# Patient Record
Sex: Female | Born: 1995 | Race: Black or African American | Hispanic: No | Marital: Single | Smoking: Never smoker
Health system: Southern US, Community
[De-identification: ages and names within clinical notes are randomized; demographics above are authoritative.]

## PROBLEM LIST (undated history)

## (undated) DIAGNOSIS — E061 Subacute thyroiditis: Secondary | ICD-10-CM

## (undated) DIAGNOSIS — F419 Anxiety disorder, unspecified: Secondary | ICD-10-CM

## (undated) HISTORY — DX: Subacute thyroiditis: E06.1

---
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
## 2017-02-05 ENCOUNTER — Telehealth: Payer: Self-pay | Admitting: Internal Medicine

## 2017-02-05 NOTE — Telephone Encounter (Signed)
Appt. has been changed to 30 mins.

## 2017-02-05 NOTE — Telephone Encounter (Unsigned)
Copied from CRM [id number]#[id number]. Topic: Appointment Scheduling - Scheduling Inquiry for Clinic >> Feb 05, 2017 11:13 AM Raquel SarnaHayes, Teresa G wrote: I made an appt for pt on Wed. @ 9:45.  However, it will not allow me to use 30 mins.  There was a available time after this time to make a 30 min appt. Can this be fixed?   Pt needs to get in to see Dr. Fabian SharpPanosh asap.

## 2017-02-07 ENCOUNTER — Ambulatory Visit (INDEPENDENT_AMBULATORY_CARE_PROVIDER_SITE_OTHER): Payer: BLUE CROSS/BLUE SHIELD | Admitting: Internal Medicine

## 2017-02-07 ENCOUNTER — Encounter: Payer: Self-pay | Admitting: Internal Medicine

## 2017-02-07 VITALS — BP 124/82 | HR 71 | Temp 98.8°F | Wt 164.4 lb

## 2017-02-07 DIAGNOSIS — Z79899 Other long term (current) drug therapy: Secondary | ICD-10-CM

## 2017-02-07 DIAGNOSIS — F418 Other specified anxiety disorders: Secondary | ICD-10-CM

## 2017-02-07 MED ORDER — CITALOPRAM HYDROBROMIDE 20 MG PO TABS: ORAL_TABLET | ORAL | 1 refills | Status: DC

## 2017-02-07 NOTE — Patient Instructions (Addendum)
Continue counseling   Plan add medication and increase dose as helpful .  nuisance side effects usually gone in a week if happen at all.   May take 2-5266m weeks to see  Good response . Send a  message in my chart if having ?s in interim .   Optimize sleep patterns . exercise as possible  To help mental clarity .   Plan ROV  During break  End of December early  January.

## 2017-02-07 NOTE — Progress Notes (Signed)
Chief Complaint  Patient presents with  . Medication Management    Discuss medications for anxiety and depression. [facility]     HPI: Brandy Sanchez 21 y.o. come in for  Medication help for depressive anxiety sx     Seeing counselor at school [employer] but  Consider   Thinking  Of  Changing school.   Dec motivation .    Some anxiety and hyperventilation .   No obv triggers .  Not doing as well. And wants   Was nursing and   Public health  And hispanic      ? Buspar. Mentioned   Night hard.  Neg tad  Feels safe but is starting to isolate herself and dec motivation ROS: See pertinent positives and negatives per HPI. No cv pulm sx   Sophomore at [employer]    Past Medical History:  Diagnosis Date  . Allergic rhinitis   . Macrocephaly    as infant  . Viral thyroiditis 09/09/2013    Family History  Problem Relation Age of Onset  . Thyroid disease Mother        removed due to goiter and trouble swallowing  . Hypertension Mother   . Thyroid disease Maternal Grandmother   . Diabetes Paternal Grandmother   . Thyroid disease Maternal Aunt        removed- enlarged    Social History   Socioeconomic History  . Marital status: Single    Spouse name: None  . Number of children: None  . Years of education: None  . Highest education level: None  Social Needs  . Financial resource strain: None  . Food insecurity - worry: None  . Food insecurity - inability: None  . Transportation needs - medical: None  . Transportation needs - non-medical: None  Occupational History  . None  Tobacco Use  . Smoking status: Never Smoker  . Smokeless tobacco: Never Used  Substance and Sexual Activity  . Alcohol use: No  . Drug use: No  . Sexual activity: No  Other Topics Concern  . 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 ibuprofen (ADVIL,MOTRIN) 600 MG tablet Take 1 tablet (600 mg total) by mouth every 6 (six) hours as needed. 30 tablet 0  . levonorgestrel-ethinyl estradiol (AVIANE,ALESSE,LESSINA) 0.1-20 MG-MCG tablet Take 1 tablet by mouth daily. 1 Package 11  . lidocaine (XYLOCAINE) 2 % jelly Apply 1 application topically as needed. 30 mL 0  . metroNIDAZOLE (FLAGYL) 500 MG tablet Take 1 tablet (500 mg total) by mouth 2 (two) times daily. (Patient not taking: Reported on 02/07/2017) 14 tablet 0   No facility-administered medications prior to visit.      EXAM:  BP 124/82 (BP Location: Right Arm, Patient Position: Sitting, Cuff Size: Normal)   Pulse 71   Temp 98.8 F (37.1 C) (Oral)   Wt 164 lb 6.4 oz (74.6 kg)   BMI 27.39 kg/m   Body mass index is 27.39 kg/m.  GENERAL: vitals reviewed and listed above, alert, oriented, appears well hydrated and in no acute distress HEENT: atraumatic,  conjunctiva  clear, no obvious abnormalities on inspection of external nose and ears PSYCH: pleasant and cooperative, no obvious depression or anxiety Lab Results  Component Value Date   WBC 5.1 01/21/2013   HGB 12.7 07/23/2015   HCT 39.0 01/21/2013   PLT 370.0 01/21/2013   GLUCOSE 62 (L) 01/21/2013   CHOL 139 01/21/2013   TRIG 85.0 01/21/2013   HDL 58.30 01/21/2013   LDLCALC 64 01/21/2013   ALT 21 01/21/2013   AST 19 01/21/2013   NA 138 01/21/2013   K 3.9 01/21/2013   CL 104 01/21/2013   CREATININE 0.9 01/21/2013   BUN 11 01/21/2013   CO2 28 01/21/2013   TSH 0.731 05/22/2013   BP Readings from Last 3 Encounters:  02/07/17 124/82  12/04/16 (!) 100/58  10/20/16 118/72 (72 %, Z = 0.58 /  76 %, Z = 0.71)*   *BP percentiles are based on the August 2017 AAP Clinical Practice Guideline for girls   PHQ-SADS Somatic: 18 tired sleep GI  GAD7:9  Pos panic  PHQ9:23 not suicidal Difficulty  :extreme  .ASSESSMENT AND PLAN:  Discussed the following assessment and plan:  Depression with anxiety  Medication management Some developmental  Counseling  Appropriate    Add controller med feel ssri m,ay be better than buspar alone     Expectant management. Benefit more than risk of med .  Plan rov end dec  Contact us in  3 weeks if needed about  Med or any concerns  To continue counseling  -Patient advised to return or notify health care team  if  new concerns arise.  Patient Instructions  Continue counseling   Plan add medication and increase dose as helpful .  nuisance side effects usually gone in a week if happen at all.   May take 2-6060m weeks to see  Good response . Send a  message in my chart if having ?s in interim .   Optimize sleep patterns . exercise as possible  To help mental clarity .   Plan ROV  During break  End of December early  January.       Neta MendsWanda K. Panosh M.D.

## 2017-03-07 ENCOUNTER — Encounter: Payer: Self-pay | Admitting: Internal Medicine

## 2017-03-08 NOTE — Telephone Encounter (Signed)
Please call in #30 of these

## 2017-03-08 NOTE — Telephone Encounter (Signed)
Dr Clent RidgesFry please advise if you are okay with sending a temporary Rx for the Citalopram.  [place] in [place], she is home for Winter Break x 2 weeks.  Pt aware that Dr Fabian SharpPanosh is out until 05/13/16  Last filled 02/07/17 Pt is currently taking 20mg  tabs daily.   citalopram (CELEXA) 20 MG tablet [[id number]][[id number]]  Dose, Route, Frequency: As Directed   Dispense Quantity: 30 tablet Refills: 1 Fills remaining: --        Sig: Take 10 mg Po qd x 1 week ,then 20 mg Po  Qd Or as directed

## 2017-03-09 MED ORDER — CITALOPRAM HYDROBROMIDE 20 MG PO TABS: ORAL_TABLET | ORAL | 0 refills | Status: DC

## 2017-03-09 NOTE — Telephone Encounter (Signed)
Citalopram sent # 30 to [facility], [address].

## 2017-03-29 ENCOUNTER — Encounter: Payer: Self-pay | Admitting: Internal Medicine

## 2017-03-29 NOTE — Telephone Encounter (Signed)
If she is taking 20 gm  Have her decrease to 10 mg per day for a week and then decide if we need to wean or change medication   E mail in  Or rov   I doubt  If the the jaw problem is from the medicaiton  I would have her see someone at school or  [employer] about the jaw issue

## 2017-03-29 NOTE — Telephone Encounter (Signed)
Please advise Dr Panosh, thanks.   

## 2017-05-04 ENCOUNTER — Other Ambulatory Visit: Payer: Self-pay | Admitting: Internal Medicine

## 2017-06-03 ENCOUNTER — Other Ambulatory Visit: Payer: Self-pay | Admitting: Internal Medicine

## 2017-06-07 NOTE — Telephone Encounter (Signed)
Celexa refilled one time, needs OV for further refills for med check. Pt aware via voicemail - detailed message left.  Nothing further needed.

## 2017-06-27 ENCOUNTER — Encounter: Payer: Self-pay | Admitting: Physician Assistant

## 2017-07-12 ENCOUNTER — Other Ambulatory Visit: Payer: Self-pay | Admitting: Internal Medicine
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
## 2017-07-27 NOTE — Progress Notes (Signed)
Chief Complaint  Patient presents with  . Annual Exam    Discuss Birth Control / Discuss changing Citalopram / Discuss getting pap and mammo    HPI: Patient  Brandy Sanchez  22 y.o. comes in today for Preventive Health Care visit   And med  ocps    Stopped celexa about  march  Felt it helped  Her anxiety but  Felt   Physically stiff   Stopped  And got  Better anxiety not as bad but coming back   Counseling had helped  Only took 10 mg    citalopram  Would like to try something else  Not as bad as before Periods : lmp April 18   None feb march   On ocps    ocass missed pill skipped 2 mos and then last  Was ok April  Some weight gain .   Health Maintenance  Topic Date Due  . INFLUENZA VACCINE  10/18/2017  . TETANUS/TDAP  08/27/2018  . HIV Screening  Completed   Health Maintenance Review LIFESTYLE:  Exercise:   Just started  back exercise    Tobacco/ETS:  no Alcohol:   no Sugar beverages: every day  Sleep: 6   -7  Drug use: no HH of off campus  1  Fish  [employer]  public health   ROS: see above  GEN/ HEENT: No fever, significant weight changes sweats headaches vision problems hearing changes, CV/ PULM; No chest pain shortness of breath cough, syncope,edema  change in exercise tolerance. GI /GU: No adominal pain, vomiting, change in bowel habits. No blood in the stool. No significant GU symptoms. SKIN/HEME: ,no acute skin rashes suspicious lesions or bleeding. No lymphadenopathy, nodules, masses.  NEURO/ PSYCH:  No neurologic signs such as weakness numbness. . IMM/ Allergy: No unusual infections.  Allergy .   REST of 12 system review negative except as per HPI   Past Medical History:  Diagnosis Date  . Allergic rhinitis   . Macrocephaly    as infant  . Viral thyroiditis 09/09/2013    No past surgical history on file.  Family History  Problem Relation Age of Onset  . Thyroid disease Mother        removed due to goiter and trouble swallowing  . Hypertension  Mother   . Thyroid disease Maternal Grandmother   . Diabetes Paternal Grandmother   . Thyroid disease Maternal Aunt        removed- enlarged    Social History   Socioeconomic History  . Marital status: Single    Spouse name: Not on file  . Number of children: Not on file  . Years of education: Not on file  . Highest education level: Not on file  Occupational History  . Not on file  Social Needs  . Financial resource strain: Not on file  . Food insecurity:    Worry: Not on file    Inability: Not on file  . Transportation needs:    Medical: Not on file    Non-medical: Not on file  Tobacco Use  . Smoking status: Never Smoker  . Smokeless tobacco: Never Used  Substance and Sexual Activity  . Alcohol use: No  . Drug use: No  . Sexual activity: Never  Lifestyle  . Physical activity:    Days per week: Not on file    Minutes per session: Not on file  . Stress: Not on file  Relationships  . Social connections:  Talks on phone: Not on file    Gets together: Not on file    Attends religious service: Not on file    Active member of club or organization: Not on file    Attends meetings of clubs or organizations: Not on file    Relationship status: Not on file  Other Topics Concern  . 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 citalopram (CELEXA) 20 MG tablet TAKE 1/2 TAB DAILY X 1 WEEK, THEN 1 TAB DAILY OR AS DIRECTED. **DUE FOR MED CHECK APPT 30 tablet 0  . ibuprofen (ADVIL,MOTRIN) 600 MG tablet Take 1 tablet (600 mg total) by mouth every 6 (six) hours as needed. 30 tablet 0  . levonorgestrel-ethinyl estradiol (AVIANE,ALESSE,LESSINA) 0.1-20 MG-MCG tablet Take 1 tablet by mouth daily. 1 Package 11  .  lidocaine (XYLOCAINE) 2 % jelly Apply 1 application topically as needed. 30 mL 0   No facility-administered medications prior to visit.      EXAM:  BP 124/82 (BP Location: Right Arm, Patient Position: Sitting, Cuff Size: Normal)   Pulse 74   Temp 98.1 F (36.7 C) (Oral)   Ht 5' 3.75" (1.619 m)   Wt 172 lb 12.8 oz (78.4 kg)   BMI 29.89 kg/m   Body mass index is 29.89 kg/m. Wt Readings from Last 3 Encounters:  07/30/17 172 lb 12.8 oz (78.4 kg)  02/07/17 164 lb 6.4 oz (74.6 kg) (89 %, Z= 1.25)*  12/04/16 167 lb 3.2 oz (75.8 kg) (91 %, Z= 1.33)*   * Growth percentiles are based on CDC (Girls, 2-20 Years) data.    Physical Exam: Vital signs reviewed FXT:KWIO is a well-developed well-nourished alert cooperative    who appearsr stated age in no acute distress.  HEENT: normocephalic atraumatic , Eyes: PERRL EOM's full, conjunctiva clear, Nares: paten,t no deformity discharge or tenderness., Ears: no deformity EAC's clear TMs with normal landmarks. Mouth: clear OP, no lesions, edema.  Moist mucous membranes. Dentition in adequate repair. NECK: supple without masses palpable thyroid  or bruits. CHEST/PULM:  Clear to auscultation and percussion breath sounds equal no wheeze , rales or rhonchi. No chest wall deformities or tenderness. Breast: normal by inspection . No dimpling, discharge, masses, tenderness or discharge . CV: PMI is nondisplaced, S1 S2 no gallops, murmurs, rubs. Peripheral pulses are full without delay.No JVD .  ABDOMEN: Bowel sounds normal nontender  No guard or rebound, no hepato splenomegal no CVA tenderness.  No hernia. Extremtities:  No clubbing cyanosis or edema, no acute joint swelling or redness no focal atrophy NEURO:  Oriented x3, cranial nerves 3-12 appear to be intact, no obvious focal weakness,gait within normal limits no abnormal reflexes or asymmetrical SKIN: No acute rashes normal turgor, color, no bruising or petechiae. PSYCH: Oriented, good eye contact,  no obvious depression anxiety, cognition and judgment appear normal. LN: no cervical axillary inguinal adenopathy  Lab Results  Component Value Date   WBC 4.5 07/30/2017   HGB 13.0 07/30/2017   HCT 38.6 07/30/2017   PLT 417.0 (H) 07/30/2017   GLUCOSE 78 07/30/2017   CHOL 123  07/30/2017   TRIG 44.0 07/30/2017   HDL 48.10 07/30/2017   LDLCALC 66 07/30/2017   ALT 25 07/30/2017   AST 35 07/30/2017   NA 139 07/30/2017   K 4.2 07/30/2017   CL 107 07/30/2017   CREATININE 0.87 07/30/2017   BUN 8 07/30/2017   CO2 26 07/30/2017   TSH 0.78 07/30/2017    BP Readings from Last 3 Encounters:  07/30/17 124/82  02/07/17 124/82  12/04/16 (!) 100/58    Lab results reviewed with patient   ASSESSMENT AND PLAN:  Discussed the following assessment and plan:  Visit for preventive health examination - Plan: Urine cytology ancillary only, CBC with Differential/Platelet, Lipid panel, TSH, CMP, HIV antibody, RPR, T4, free  Medication management - Plan: Urine cytology ancillary only, CBC with Differential/Platelet, Lipid panel, TSH, CMP, HIV antibody, RPR, T4, free  Oral contraceptive use - skipped menses  until april and nl  follow and  foucs on avoiding missed doses - Plan: Urine cytology ancillary only, CBC with Differential/Platelet, Lipid panel, TSH, CMP, HIV antibody, RPR, T4, free  Medication side effect - Plan: Urine cytology ancillary only, CBC with Differential/Platelet, Lipid panel, TSH, CMP, HIV antibody, RPR, T4, free  Depression with anxiety - Plan: Urine cytology ancillary only, CBC with Differential/Platelet, Lipid panel, TSH, CMP, HIV antibody, RPR, T4, free  Routine screening for STI (sexually transmitted infection) - Plan: Urine cytology ancillary only  Patient Care Team: Burnis Medin, MD as PCP - General Patient Instructions  Get back with counseling   Trying different med and fu   In 1 montth or thereabouts  For med check   Screening labs and  Urine today.   Will  notify you  of labs when available. Plan low dose fluoxetine  10 mg and can increase to 20 mg  After 2-3 weeks   ROV in 1 months or as needed   PAP smear when  23  And mammogram when about 48 - 50 years of age .     Preventive Care for [place], Female The transition to life after high school as a young adult can be a stressful time with many changes. You may start seeing a primary care physician instead of a pediatrician. This is the time when your health care becomes your responsibility. Preventive care refers to lifestyle choices and visits with your health care provider that can promote health and wellness. What does preventive care include?  A yearly physical exam. This is also called an annual wellness visit.  Dental exams once or twice a year.  Routine eye exams. Ask your health care provider how often you should have your eyes checked.  Personal lifestyle choices, including: ? Daily care of your teeth and gums. ? Regular physical activity. ? Eating a healthy diet. ? Avoiding tobacco and drug use. ? Avoiding or limiting alcohol use. ? Practicing safe sex. ? Taking vitamin and mineral supplements as recommended by your health care provider. What happens during an annual wellness visit? Preventive care starts with a yearly visit to your primary care physician. The services and screenings done by your health care provider during your annual wellness visit will depend on your overall health, lifestyle risk factors, and family history of disease. Counseling Your health care provider may ask you questions about:  Past medical problems and your family's medical history.  Medicines or supplements you take.  Health insurance and access to health care.  Alcohol, tobacco, and drug use.  Your safety at home,  work, or school.  Access to firearms.  Emotional well-being and how you cope with stress.  Relationship well-being.  Diet, exercise, and sleep habits.  Your  sexual health and activity.  Your methods of birth control.  Your menstrual cycle.  Your pregnancy history.  Screening You may have the following tests or measurements:  Height, weight, and BMI.  Blood pressure.  Lipid and cholesterol levels.  Tuberculosis skin test.  Skin exam.  Vision and hearing tests.  Screening test for hepatitis.  Screening tests for sexually transmitted diseases (STDs), if you are at risk.  BRCA-related cancer screening. This may be done if you have a family history of breast, ovarian, tubal, or peritoneal cancers.  Pelvic exam and Pap test. This may be done every 3 years starting at age 31.  Vaccines Your health care provider may recommend certain vaccines, such as:  Influenza vaccine. This is recommended every year.  Tetanus, diphtheria, and acellular pertussis (Tdap, Td) vaccine. You may need a Td booster every 10 years.  Varicella vaccine. You may need this if you have not been vaccinated.  HPV vaccine. If you are 49 or younger, you may need three doses over 6 months.  Measles, mumps, and rubella (MMR) vaccine. You may need at least one dose of MMR. You may also need a second dose.  Pneumococcal 13-valent conjugate (PCV13) vaccine. You may need this if you have certain conditions and were not previously vaccinated.  Pneumococcal polysaccharide (PPSV23) vaccine. You may need one or two doses if you smoke cigarettes or if you have certain conditions.  Meningococcal vaccine. One dose is recommended if you are age 27-21 years and a first-year college student living in a residence hall, or if you have one of several medical conditions. You may also need additional booster doses.  Hepatitis A vaccine. You may need this if you have certain conditions or if you travel or work in places where you may be exposed to hepatitis A.  Hepatitis B vaccine. You may need this if you have certain conditions or if you travel or work in places where you may  be exposed to hepatitis B.  Haemophilus influenzae type b (Hib) vaccine. You may need this if you have certain risk factors.  Talk to your health care provider about which screenings and vaccines you need and how often you need them. What steps can I take to develop healthy behaviors?  Have regular preventive health care visits with your primary care physician and dentist.  Eat a healthy diet.  Drink enough fluid to keep your urine clear or pale yellow.  Stay active. Exercise at least 30 minutes 5 or more days of the week.  Use alcohol responsibly.  Maintain a healthy weight.  Do not use any products that contain nicotine, such as cigarettes, chewing tobacco, and e-cigarettes. If you need help quitting, ask your health care provider.  Do not use drugs.  Practice safe sex.  Use birth control (contraception) to prevent unwanted pregnancy. If you plan to become pregnant, see your health care provider for a pre-conception visit.  Find healthy ways to manage stress. How can I protect myself from injury? Injuries from violence or accidents are the leading cause of death among young adults and can often be prevented. Take these steps to help protect yourself:  Always wear your seat belt while driving or riding in a vehicle.  Do not drive if you have been drinking alcohol. Do not ride with someone who has been drinking.  Do not drive when you are tired or distracted. Do not text while driving.  Wear a helmet and other protective equipment during sports activities.  If you have firearms in your house, make sure you follow all gun safety procedures.  Seek help if you have been bullied, physically abused, or sexually abused.  Use the Internet responsibly to avoid dangers such as online bullying and online sexual predators.  What can I do to cope with stress? Young adults may face many new challenges that can be stressful, such as finding a job, going to college, moving away from  home, managing money, being in a relationship, getting married, and having children. To manage stress:  Avoid known stressful situations when you can.  Exercise regularly.  Find a stress-reducing activity that works best for you. Examples include meditation, yoga, listening to music, or reading.  Spend time in nature.  Keep a journal to write about your stress and how you respond.  Talk to your health care provider about stress. He or she may suggest counseling.  Spend time with supportive friends or family.  Do not cope with stress by: ? Drinking alcohol or using drugs. ? Smoking cigarettes. ? Eating.  Where can I get more information? Learn more about preventive care and healthy habits from:  [place] and Gynecologists: KaraokeExchange.nl  U.S. Probation officer Task Force: StageSync.si  National Adolescent and [place]: StrategicRoad.nl  American Academy of Pediatrics Bright Futures: https://brightfutures.MemberVerification.co.za  Society for Adolescent Health and Medicine: MoralBlog.co.za.aspx  PodExchange.nl: ToyLending.fr  This information is not intended to replace advice given to you by your health care provider. Make sure you discuss any questions you have with your health care provider. Document Released: 07/22/2015 Document Revised: 08/12/2015 Document Reviewed: 07/22/2015 Elsevier Interactive Patient Education  2018 [place]. Tracyann Duffell M.D.

## 2017-07-30 ENCOUNTER — Other Ambulatory Visit (HOSPITAL_COMMUNITY)
Admission: RE | Admit: 2017-07-30 | Discharge: 2017-07-30 | Disposition: A | Discharge status: Home / Self Care | Payer: BLUE CROSS/BLUE SHIELD | Source: Ambulatory Visit | Attending: Internal Medicine | Admitting: Internal Medicine

## 2017-07-30 ENCOUNTER — Encounter: Payer: Self-pay | Admitting: Internal Medicine

## 2017-07-30 ENCOUNTER — Ambulatory Visit (INDEPENDENT_AMBULATORY_CARE_PROVIDER_SITE_OTHER): Payer: BLUE CROSS/BLUE SHIELD | Admitting: Internal Medicine

## 2017-07-30 VITALS — BP 124/82 | HR 74 | Temp 98.1°F | Ht 63.75 in | Wt 172.8 lb

## 2017-07-30 DIAGNOSIS — Z3041 Encounter for surveillance of contraceptive pills: Secondary | ICD-10-CM | POA: Diagnosis not present

## 2017-07-30 DIAGNOSIS — Z79899 Other long term (current) drug therapy: Secondary | ICD-10-CM

## 2017-07-30 DIAGNOSIS — Z0000 Encounter for general adult medical examination without abnormal findings: Secondary | ICD-10-CM | POA: Insufficient documentation

## 2017-07-30 DIAGNOSIS — Z113 Encounter for screening for infections with a predominantly sexual mode of transmission: Secondary | ICD-10-CM

## 2017-07-30 DIAGNOSIS — T887XXA Unspecified adverse effect of drug or medicament, initial encounter: Secondary | ICD-10-CM

## 2017-07-30 DIAGNOSIS — F418 Other specified anxiety disorders: Secondary | ICD-10-CM

## 2017-07-30 LAB — CBC WITH DIFFERENTIAL/PLATELET
BASOS ABS: 0 10*3/uL (ref 0.0–0.1)
BASOS PCT: 0.6 % (ref 0.0–3.0)
EOS PCT: 2.7 % (ref 0.0–5.0)
Eosinophils Absolute: 0.1 10*3/uL (ref 0.0–0.7)
HEMATOCRIT: 38.6 % (ref 36.0–46.0)
Hemoglobin: 13 g/dL (ref 12.0–15.0)
LYMPHS ABS: 2.1 10*3/uL (ref 0.7–4.0)
LYMPHS PCT: 47.3 % — AB (ref 12.0–46.0)
MCHC: 33.7 g/dL (ref 30.0–36.0)
MCV: 88.6 fl (ref 78.0–100.0)
MONOS PCT: 9.4 % (ref 3.0–12.0)
Monocytes Absolute: 0.4 10*3/uL (ref 0.1–1.0)
NEUTROS ABS: 1.8 10*3/uL (ref 1.4–7.7)
Neutrophils Relative %: 40 % — ABNORMAL LOW (ref 43.0–77.0)
PLATELETS: 417 10*3/uL — AB (ref 150.0–400.0)
RBC: 4.36 Mil/uL (ref 3.87–5.11)
RDW: 12.2 % (ref 11.5–14.6)
WBC: 4.5 10*3/uL (ref 4.5–10.5)

## 2017-07-30 LAB — COMPREHENSIVE METABOLIC PANEL
ALT: 25 U/L (ref 0–35)
AST: 35 U/L (ref 0–37)
Albumin: 4.2 g/dL (ref 3.5–5.2)
Alkaline Phosphatase: 46 U/L (ref 39–117)
BUN: 8 mg/dL (ref 6–23)
CALCIUM: 9.4 mg/dL (ref 8.4–10.5)
CO2: 26 meq/L (ref 19–32)
CREATININE: 0.87 mg/dL (ref 0.40–1.20)
Chloride: 107 mEq/L (ref 96–112)
GFR: 106.5 mL/min (ref >60.00)
GLUCOSE: 78 mg/dL (ref 70–99)
Potassium: 4.2 mEq/L (ref 3.5–5.1)
Sodium: 139 mEq/L (ref 135–145)
Total Bilirubin: 0.4 mg/dL (ref 0.2–1.2)
Total Protein: 7.4 g/dL (ref 6.0–8.3)

## 2017-07-30 LAB — T4, FREE: Free T4: 0.88 ng/dL (ref 0.60–1.60)

## 2017-07-30 LAB — TSH: TSH: 0.78 u[IU]/mL (ref 0.35–5.50)

## 2017-07-30 LAB — LIPID PANEL
CHOL/HDL RATIO: 3
Cholesterol: 123 mg/dL (ref 0–200)
HDL: 48.1 mg/dL (ref >39.00)
LDL Cholesterol: 66 mg/dL (ref 0–99)
NONHDL: 74.45
Triglycerides: 44 mg/dL (ref 0.0–149.0)
VLDL: 8.8 mg/dL (ref 0.0–40.0)

## 2017-07-30 MED ORDER — FLUOXETINE HCL 10 MG PO CAPS: 10.0000 mg | ORAL_CAPSULE | Freq: Every day | ORAL | 0 refills | Status: DC

## 2017-07-30 NOTE — Patient Instructions (Addendum)
Get back with counseling   Trying different med and fu   In 1 montth or thereabouts  For med check   Screening labs and  Urine today.   Will notify you  of labs when available. Plan low dose fluoxetine  10 mg and can increase to 20 mg  After 2-3 weeks   ROV in 1 months or as needed   PAP smear when  23  And mammogram when about 39 - 20 years of age .     Preventive Care for [place], Female The transition to life after high school as a young adult can be a stressful time with many changes. You may start seeing a primary care physician instead of a pediatrician. This is the time when your health care becomes your responsibility. Preventive care refers to lifestyle choices and visits with your health care provider that can promote health and wellness. What does preventive care include?  A yearly physical exam. This is also called an annual wellness visit.  Dental exams once or twice a year.  Routine eye exams. Ask your health care provider how often you should have your eyes checked.  Personal lifestyle choices, including: ? Daily care of your teeth and gums. ? Regular physical activity. ? Eating a healthy diet. ? Avoiding tobacco and drug use. ? Avoiding or limiting alcohol use. ? Practicing safe sex. ? Taking vitamin and mineral supplements as recommended by your health care provider. What happens during an annual wellness visit? Preventive care starts with a yearly visit to your primary care physician. The services and screenings done by your health care provider during your annual wellness visit will depend on your overall health, lifestyle risk factors, and family history of disease. Counseling Your health care provider may ask you questions about:  Past medical problems and your family's medical history.  Medicines or supplements you take.  Health insurance and access to health care.  Alcohol, tobacco, and drug use.  Your safety at home, work, or school.  Access  to firearms.  Emotional well-being and how you cope with stress.  Relationship well-being.  Diet, exercise, and sleep habits.  Your sexual health and activity.  Your methods of birth control.  Your menstrual cycle.  Your pregnancy history.  Screening You may have the following tests or measurements:  Height, weight, and BMI.  Blood pressure.  Lipid and cholesterol levels.  Tuberculosis skin test.  Skin exam.  Vision and hearing tests.  Screening test for hepatitis.  Screening tests for sexually transmitted diseases (STDs), if you are at risk.  BRCA-related cancer screening. This may be done if you have a family history of breast, ovarian, tubal, or peritoneal cancers.  Pelvic exam and Pap test. This may be done every 3 years starting at age 84.  Vaccines Your health care provider may recommend certain vaccines, such as:  Influenza vaccine. This is recommended every year.  Tetanus, diphtheria, and acellular pertussis (Tdap, Td) vaccine. You may need a Td booster every 10 years.  Varicella vaccine. You may need this if you have not been vaccinated.  HPV vaccine. If you are 65 or younger, you may need three doses over 6 months.  Measles, mumps, and rubella (MMR) vaccine. You may need at least one dose of MMR. You may also need a second dose.  Pneumococcal 13-valent conjugate (PCV13) vaccine. You may need this if you have certain conditions and were not previously vaccinated.  Pneumococcal polysaccharide (PPSV23) vaccine. You may need one or  two doses if you smoke cigarettes or if you have certain conditions.  Meningococcal vaccine. One dose is recommended if you are age 82-21 years and a first-year college student living in a residence hall, or if you have one of several medical conditions. You may also need additional booster doses.  Hepatitis A vaccine. You may need this if you have certain conditions or if you travel or work in places where you may be exposed  to hepatitis A.  Hepatitis B vaccine. You may need this if you have certain conditions or if you travel or work in places where you may be exposed to hepatitis B.  Haemophilus influenzae type b (Hib) vaccine. You may need this if you have certain risk factors.  Talk to your health care provider about which screenings and vaccines you need and how often you need them. What steps can I take to develop healthy behaviors?  Have regular preventive health care visits with your primary care physician and dentist.  Eat a healthy diet.  Drink enough fluid to keep your urine clear or pale yellow.  Stay active. Exercise at least 30 minutes 5 or more days of the week.  Use alcohol responsibly.  Maintain a healthy weight.  Do not use any products that contain nicotine, such as cigarettes, chewing tobacco, and e-cigarettes. If you need help quitting, ask your health care provider.  Do not use drugs.  Practice safe sex.  Use birth control (contraception) to prevent unwanted pregnancy. If you plan to become pregnant, see your health care provider for a pre-conception visit.  Find healthy ways to manage stress. How can I protect myself from injury? Injuries from violence or accidents are the leading cause of death among young adults and can often be prevented. Take these steps to help protect yourself:  Always wear your seat belt while driving or riding in a vehicle.  Do not drive if you have been drinking alcohol. Do not ride with someone who has been drinking.  Do not drive when you are tired or distracted. Do not text while driving.  Wear a helmet and other protective equipment during sports activities.  If you have firearms in your house, make sure you follow all gun safety procedures.  Seek help if you have been bullied, physically abused, or sexually abused.  Use the Internet responsibly to avoid dangers such as online bullying and online sexual predators.  What can I do to cope  with stress? Young adults may face many new challenges that can be stressful, such as finding a job, going to college, moving away from home, managing money, being in a relationship, getting married, and having children. To manage stress:  Avoid known stressful situations when you can.  Exercise regularly.  Find a stress-reducing activity that works best for you. Examples include meditation, yoga, listening to music, or reading.  Spend time in nature.  Keep a journal to write about your stress and how you respond.  Talk to your health care provider about stress. He or she may suggest counseling.  Spend time with supportive friends or family.  Do not cope with stress by: ? Drinking alcohol or using drugs. ? Smoking cigarettes. ? Eating.  Where can I get more information? Learn more about preventive care and healthy habits from:  [place] and Gynecologists: KaraokeExchange.nl  U.S. Probation officer Task Force: StageSync.si  National Adolescent and [place]: StrategicRoad.nl  American Academy of Pediatrics Bright Futures: https://brightfutures.MemberVerification.co.za  Society  for Adolescent Health and Medicine: MoralBlog.co.za.aspx  PodExchange.nl: ToyLending.fr  This information is not intended to replace advice given to you by your health care provider. Make sure you discuss any questions you have with your health care provider. Document Released: 07/22/2015 Document Revised: 08/12/2015 Document Reviewed: 07/22/2015 Elsevier Interactive Patient Education  [employer].

## 2017-07-31 ENCOUNTER — Encounter: Payer: Self-pay | Admitting: Internal Medicine

## 2017-07-31 LAB — URINE CYTOLOGY ANCILLARY ONLY
Chlamydia: NEGATIVE
Neisseria Gonorrhea: NEGATIVE

## 2017-07-31 LAB — HIV ANTIBODY (ROUTINE TESTING W REFLEX): HIV: NONREACTIVE

## 2017-07-31 LAB — RPR: RPR: NONREACTIVE

## 2017-07-31 MED ORDER — FLUOXETINE HCL 10 MG PO CAPS: 10.0000 mg | ORAL_CAPSULE | Freq: Every day | ORAL | 0 refills | Status: DC

## 2017-07-31 NOTE — Telephone Encounter (Signed)
am resending to [facility]   Please confrim this is correct pharmacy .

## 2017-07-31 NOTE — Telephone Encounter (Signed)
Pt is requesting he Rx for Fluoxetine be sent again, states the pharmacy did not receive it.

## 2017-09-23 ENCOUNTER — Other Ambulatory Visit: Payer: Self-pay | Admitting: Internal Medicine

## 2017-09-24 ENCOUNTER — Other Ambulatory Visit: Payer: Self-pay | Admitting: Internal Medicine

## 2017-09-24 NOTE — Telephone Encounter (Signed)
She is due for   ROV med check so send in  30 days  To get her to her next visit

## 2017-09-24 NOTE — Telephone Encounter (Signed)
Can we send in for a 90 day supply?

## 2017-09-26 ENCOUNTER — Encounter: Payer: Self-pay | Admitting: Internal Medicine

## 2017-10-20 ENCOUNTER — Other Ambulatory Visit: Payer: Self-pay | Admitting: Internal Medicine

## 2017-11-13 ENCOUNTER — Ambulatory Visit: Payer: BLUE CROSS/BLUE SHIELD | Admitting: Internal Medicine

## 2017-11-13 NOTE — Progress Notes (Signed)
Chief Complaint  Patient presents with  . Follow-up    Prozac -- pt c/o hallucinations. Reports having episodes about 1-2 hours after taking dose.     HPI: Brandy Sanchez 22 y.o. come in for med evaluation  Since last visit she is switched from citalopram to fluoxetine.  She feels much better with the switch.  Feels like she has more energy and some improvement in anxiety depression.  However she has stayed at the 10 mg because she has begun to have what she calls "hallucinations" Described as sometimes seeing things out of the corner of her eye and then when looking it is gone.  She has no delusions hearing voices or seeing real figures or objects. 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  Living in an apartment.  Not seeing a counselor at this time because her previous counselor was at the [facility] in [place].  She did think that would help.  7 hours  Sleep    Seeing things  ROS: See pertinent positives and negatives per HPI.  Past Medical History:  Diagnosis Date  . Allergic rhinitis   . Macrocephaly    as infant  . Viral thyroiditis 09/09/2013    Family History  Problem Relation Age of Onset  . Thyroid disease Mother        removed due to goiter and trouble swallowing  . Hypertension Mother   . Thyroid disease Maternal Grandmother   . Diabetes Paternal Grandmother   . Thyroid disease Maternal Aunt        removed- enlarged    Social History   Socioeconomic History  . Marital status: Single    Spouse name: Not on file  . Number of children: Not on file  . Years of education: Not on file  . Highest education level: Not on file  Occupational History  . Not on file  Social Needs  . Financial resource strain: Not on file  . Food insecurity:    Worry: Not on file    Inability: Not on file  . Transportation needs:    Medical: Not on file    Non-medical: Not  on file  Tobacco Use  . Smoking status: Never Smoker  . Smokeless tobacco: Never Used  Substance and Sexual Activity  . Alcohol use: No  . Drug use: No  . Sexual activity: Never  Lifestyle  . Physical activity:    Days per week: Not on file    Minutes per session: Not on file  . Stress: Not on file  Relationships  . Social connections:    Talks on phone: Not on file    Gets together: Not on file    Attends religious service: Not on file    Active member of club or organization: Not on file    Attends meetings of clubs or organizations: Not on file    Relationship status: Not on file  Other Topics Concern  . 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  FLUoxetine (PROZAC) 10 MG capsule TAKE 1 CAPSULE (10 MG TOTAL) BY MOUTH DAILY. MAY INCREASE TO 1 TWICE DAILY IN 3 WEEKS OR AS DIRECTED 180 capsule 0  . LARISSIA 0.1-20 MG-MCG tablet TAKE 1 TABLET BY MOUTH EVERY DAY 84 tablet 0  . lidocaine (XYLOCAINE) 2 % jelly Apply 1 application topically as needed. 30 mL 0  . citalopram (CELEXA) 20 MG tablet TAKE 1/2 TAB DAILY X 1 WEEK, THEN 1 TAB DAILY OR AS DIRECTED. **DUE FOR MED CHECK APPT (Patient not taking: Reported on 11/15/2017) 30 tablet 0  . ibuprofen (ADVIL,MOTRIN) 600 MG tablet Take 1 tablet (600 mg total) by mouth every 6 (six) hours as needed. (Patient not taking: Reported on 11/15/2017) 30 tablet 0   No facility-administered medications prior to visit.      EXAM:  BP 132/78 (BP Location: Right Arm, Patient Position: Sitting, Cuff Size: Normal)   Pulse 63   Temp 98.5 F (36.9 C) (Oral)   Wt 172 lb 3.2 oz (78.1 kg)   BMI 29.79 kg/m   Body mass index is 29.79 kg/m.  GENERAL: vitals reviewed and listed above,  alert, oriented, appears well hydrated and in no acute distress HEENT: atraumatic, conjunctiva  clear, no obvious abnormalities on inspection of external nose and ears NECK: no obvious masses on inspection palpation  PSYCH: pleasant and cooperative, no obvious depression or anxiety  Calm  Will smile  Nl speech and cognition  BP Readings from Last 3 Encounters:  11/15/17 132/78  07/30/17 124/82  02/07/17 124/82   PHQ-SADS Somatic:9 GAD7:7 no panic attacks PHQ9: 8 tired interest. Difficulty :  ASSESSMENT AND PLAN:  Discussed the following assessment and plan:  Depression with anxiety  Medication management  Medication side effect ? - not sure  seems like a jumpminess   not a   halluciation oiterwise  I do not think she is having visual hallucinations but is having what sounds like a jumpiness visually uncertain if it is related to medicine but timing fits discussed options with patient of changing medicine again or continuing in trying to take 10 mg twice a day if she is getting true side effects alarming progressive symptoms she should contact us . Alarm sx  Discussed  Advised and she agrees to look into getting local counseling. She is aware and will contact us if having problems before her next visit. Our OV in 3 to 4 months with no break [facility] to follow-up anytime in the interim.  I actually think she is better and she agrees wants to stay on this medicine because she thinks it is helpful. -Patient advised to return or notify health care team  if  new concerns arise.  Patient Instructions  Not sure if the sx you are having is side effect of med or anxiety jumpiness  Can try 10 mg twice a day ( split dose) to help the anxiety  Depression sx.  And limit side effects  And look into local counseling.   If any alarming sx then let me know.   ROV in 3-4 months when on  Break   But e mail any time in interim.        Neta MendsWanda K. Axie Hayne M.D.

## 2017-11-15 ENCOUNTER — Ambulatory Visit (INDEPENDENT_AMBULATORY_CARE_PROVIDER_SITE_OTHER): Payer: BLUE CROSS/BLUE SHIELD | Admitting: Internal Medicine

## 2017-11-15 ENCOUNTER — Encounter: Payer: Self-pay | Admitting: Internal Medicine

## 2017-11-15 VITALS — BP 132/78 | HR 63 | Temp 98.5°F | Wt 172.2 lb

## 2017-11-15 DIAGNOSIS — Z79899 Other long term (current) drug therapy: Secondary | ICD-10-CM

## 2017-11-15 DIAGNOSIS — T887XXA Unspecified adverse effect of drug or medicament, initial encounter: Secondary | ICD-10-CM | POA: Diagnosis not present

## 2017-11-15 DIAGNOSIS — F418 Other specified anxiety disorders: Secondary | ICD-10-CM

## 2017-11-15 NOTE — Patient Instructions (Addendum)
Not sure if the sx you are having is side effect of med or anxiety jumpiness  Can try 10 mg twice a day ( split dose) to help the anxiety  Depression sx.  And limit side effects  And look into local counseling.   If any alarming sx then let me know.   ROV in 3-4 months when on  Break   But e mail any time in interim.

## 2017-12-14 ENCOUNTER — Other Ambulatory Visit: Payer: Self-pay | Admitting: Internal Medicine

## 2017-12-21 ENCOUNTER — Other Ambulatory Visit: Payer: Self-pay

## 2017-12-21 MED ORDER — LEVONORGESTREL-ETHINYL ESTRAD 0.1-20 MG-MCG PO TABS: 1.0000 | ORAL_TABLET | Freq: Every day | ORAL | 0 refills | Status: DC

## 2018-01-17 ENCOUNTER — Other Ambulatory Visit: Payer: Self-pay | Admitting: Internal Medicine

## 2018-03-12 ENCOUNTER — Other Ambulatory Visit: Payer: Self-pay | Admitting: Internal Medicine

## 2018-03-14 ENCOUNTER — Other Ambulatory Visit: Payer: Self-pay

## 2018-03-16 ENCOUNTER — Other Ambulatory Visit: Payer: Self-pay

## 2018-03-18 MED ORDER — LEVONORGESTREL-ETHINYL ESTRAD 0.1-20 MG-MCG PO TABS: 1.0000 | ORAL_TABLET | Freq: Every day | ORAL | 0 refills | Status: DC

## 2018-03-20 NOTE — Progress Notes (Signed)
Chief Complaint  Patient presents with  . Follow-up    anxiety fluoxetine medications makes her feel more anxious and forgetful and clumsy  . Cyst    on right hand would like to take the steps to have it removed    HPI: Brandy Sanchez 23 y.o. come in for follow-up of a couple of issues.  See above. Last ov end of August  Las pv 5  2019   Stopped taking   F;luoextine  .  Not sure was helping and still feels jittery and anxious.  Since she has been off since Thanksgiving has not really noticed a difference.  She has not gotten a Veterinary surgeon yet because she is now at [employer] and it is out of town on but is still interested in getting a Civil Service fast streamer.     Applying to nursing at [employer]   A lot more .  Negative TAD Is not going out with many friends does have a boyfriend.  Exercise yoga.  u tube.   Cyst on right wrist is getting larger comes and goes but is very large now and would like further help with this.  No fever no change in hand strength.  ROS: See pertinent positives and negatives per HPI.  Periods are regular on the IUD.  Past Medical History:  Diagnosis Date  . Allergic rhinitis   . Macrocephaly    as infant  . Viral thyroiditis 09/09/2013    Family History  Problem Relation Age of Onset  . Thyroid disease Mother        removed due to goiter and trouble swallowing  . Hypertension Mother   . Thyroid disease Maternal Grandmother   . Diabetes Paternal Grandmother   . Thyroid disease Maternal Aunt        removed- enlarged    Social History   Socioeconomic History  . Marital status: Single    Spouse name: Not on file  . Number of children: Not on file  . Years of education: Not on file  . Highest education level: Not on file  Occupational History  . Not on file  Social Needs  . Financial resource strain: Not on file  . Food insecurity:    Worry: Not on file    Inability: Not on file  . Transportation needs:   Medical: Not on file    Non-medical: Not on file  Tobacco Use  . Smoking status: Never Smoker  . Smokeless tobacco: Never Used  Substance and Sexual Activity  . Alcohol use: No  . Drug use: No  . Sexual activity: Never  Lifestyle  . Physical activity:    Days per week: Not on file    Minutes per session: Not on file  . Stress: Not on file  Relationships  . Social connections:    Talks on phone: Not on file    Gets together: Not on file    Attends religious service: Not on file    Active member of club or organization: Not on file    Attends meetings of clubs or organizations: Not on file    Relationship status: Not on file  Other Topics Concern  . 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 levonorgestrel-ethinyl estradiol (LARISSIA) 0.1-20 MG-MCG tablet Take 1 tablet by mouth daily. 84 tablet 0  . lidocaine (XYLOCAINE) 2 % jelly Apply 1 application topically as needed. 30 mL 0  . FLUoxetine (PROZAC) 10 MG capsule TAKE 1 CAPSULE (10 MG TOTAL) BY MOUTH DAILY. MAY INCREASE TO 1 TWICE DAILY IN 3 WEEKS OR AS DIRECTED 180 capsule 0   No facility-administered medications prior to visit.      EXAM:  BP 116/78 (BP Location: Right Arm, Patient Position: Sitting, Cuff Size: Normal)   Pulse 94   Temp 98.8 F (37.1 C) (Oral)   Wt 158 lb (71.7 kg)   BMI 27.33 kg/m   Body mass index is 27.33 kg/m.  GENERAL: vitals reviewed and listed above, alert, oriented, appears well hydrated and in no acute distress HEENT: atraumatic, conjunctiva  clear, no obvious abnormalities on inspection of external nose and ears MS: moves all extremities without noticeable focal  abnormality right hand wrist no atrophy noted very large  2-1/2 cm cystic area dorsum wrist nontender no redness. PSYCH: pleasant and cooperative, no obvious depression or anxiety  BP Readings from Last 3 Encounters:  03/21/18 116/78  11/15/17 132/78  07/30/17 124/82    ASSESSMENT AND PLAN:  Discussed the following assessment and plan:  Anxiety  Medication management  Ganglion cyst - Plan: Ambulatory referral to Hand Surgery Denies depressive symptoms but does have significant anxiety discussed strategies and she agrees would like to try a counselor and will look into this more aggressively discussed techniques to try to avoid isolation. Prescription printed for low-dose Lexapro 5 mg she had some side effects with citalopram nausea not sure the fluoxetine gave her side effect but did not work at the low dose. She continues to begin it and follow-up with us after that spring break in March. Referral for right ganglion cyst that is quite large options of aspiration if appropriate or other prefers appointment on Monday or Friday because she has to commute. -Patient advised to return or notify health care team  if  new concerns arise. Total visit 25mins > 50% spent counseling and coordinating care as indicated in above note and in instructions to patient .  Patient Instructions   counseling  Is very  important for helping    your anxiety .   Attention to healthy eating and  Exercise and sleep can help.   Can add low dose other med to try in addition to  Counseling to help with anxiety .    Will be contacted about  Hand cyst   appt   .     Generalized Anxiety Disorder, Adult Generalized anxiety disorder (GAD) is a mental health disorder. People with this condition constantly worry about everyday events. Unlike normal anxiety, worry related to GAD is not triggered by a specific event. These worries also do not fade or get better with time. GAD interferes with life functions, including relationships, work, and school. GAD can vary from mild  to severe. People with severe GAD can have intense waves of anxiety with physical symptoms (panic attacks). What are the causes? The exact cause of GAD is not known. What increases the risk? This condition is more likely to develop in:  Women.  People who have a family history of anxiety disorders.  People who are very shy.  People who experience very stressful life events, such as the death of a loved one.  People who have a very stressful family environment. What are the signs or symptoms? People with GAD often worry excessively about many things in their lives, such as their health and family. They may also be overly concerned about:  Doing well at work.  Being on time.  Natural disasters.  Friendships. Physical symptoms of GAD include:  Fatigue.  Muscle tension or having muscle twitches.  Trembling or feeling shaky.  Being easily startled.  Feeling like your heart is pounding or racing.  Feeling out of breath or like you cannot take a deep breath.  Having trouble falling asleep or staying asleep.  Sweating.  Nausea, diarrhea, or irritable bowel syndrome (IBS).  Headaches.  Trouble concentrating or remembering facts.  Restlessness.  Irritability. How is this diagnosed? Your health care provider can diagnose GAD based on your symptoms and medical history. You will also have a physical exam. The health care provider will ask specific questions about your symptoms, including how severe they are, when they started, and if they come and go. Your health care provider may ask you about your use of alcohol or drugs, including prescription medicines. Your health care provider may refer you to a mental health specialist for further evaluation. Your health care provider will do a thorough examination and may perform additional tests to rule out other possible causes of your symptoms. To be diagnosed with GAD, a person must have anxiety that:  Is out of his or her  control.  Affects several different aspects of his or her life, such as work and relationships.  Causes distress that makes him or her unable to take part in normal activities.  Includes at least three physical symptoms of GAD, such as restlessness, fatigue, trouble concentrating, irritability, muscle tension, or sleep problems. Before your health care provider can confirm a diagnosis of GAD, these symptoms must be present more days than they are not, and they must last for six months or longer. How is this treated? The following therapies are usually used to treat GAD:  Medicine. Antidepressant medicine is usually prescribed for long-term daily control. Antianxiety medicines may be added in severe cases, especially when panic attacks occur.  Talk therapy (psychotherapy). Certain types of talk therapy can be helpful in treating GAD by providing support, education, and guidance. Options include: ? Cognitive behavioral therapy (CBT). People learn coping skills and techniques to ease their anxiety. They learn to identify unrealistic or negative thoughts and behaviors and to replace them with positive ones. ? Acceptance and commitment therapy (ACT). This treatment teaches people how to be mindful as a way to cope with unwanted thoughts and feelings. ? Biofeedback. This process trains you to manage your body's response (physiological response) through breathing techniques and relaxation methods. You will work with a therapist while machines are used to monitor your physical symptoms.  Stress management techniques. These include yoga, meditation, and exercise. A mental health specialist can help determine which treatment is best for you. Some people see improvement with one type of therapy. However, other people require a combination of therapies. Follow these instructions at home:  Take over-the-counter and prescription medicines only as told by your health care provider.  Try to maintain a normal  routine.  Try to anticipate stressful situations and allow extra time to manage them.  Practice any stress management or self-calming techniques as taught by your health care provider.  Do not punish  yourself for setbacks or for not making progress.  Try to recognize your accomplishments, even if they are small.  Keep all follow-up visits as told by your health care provider. This is important. Contact a health care provider if:  Your symptoms do not get better.  Your symptoms get worse.  You have signs of depression, such as: ? A persistently sad, cranky, or irritable mood. ? Loss of enjoyment in activities that used to bring you joy. ? Change in weight or eating. ? Changes in sleeping habits. ? Avoiding friends or family members. ? Loss of energy for normal tasks. ? Feelings of guilt or worthlessness. Get help right away if:  You have serious thoughts about hurting yourself or others. If you ever feel like you may hurt yourself or others, or have thoughts about taking your own life, get help right away. You can go to your nearest emergency department or call:  Your local emergency services (911 in the [place]  A suicide crisis helpline, such as the National Suicide Prevention Lifeline at 775-359-33841-417-123-6068. This is open 24 hours a day. Summary  Generalized anxiety disorder (GAD) is a mental health disorder that involves worry that is not triggered by a specific event.  People with GAD often worry excessively about many things in their lives, such as their health and family.  GAD may cause physical symptoms such as restlessness, trouble concentrating, sleep problems, frequent sweating, nausea, diarrhea, headaches, and trembling or muscle twitching.  A mental health specialist can help determine which treatment is best for you. Some people see improvement with one type of therapy. However, other people require a combination of therapies. This information is not intended to  replace advice given to you by your health care provider. Make sure you discuss any questions you have with your health care provider. Document Released: 07/01/2012 Document Revised: 01/25/2016 Document Reviewed: 01/25/2016 Elsevier Interactive Patient Education  2019 [employer]     CorinthWanda K. Rosela Supak M.D.

## 2018-03-21 ENCOUNTER — Encounter: Payer: Self-pay | Admitting: Internal Medicine

## 2018-03-21 ENCOUNTER — Ambulatory Visit: Payer: BLUE CROSS/BLUE SHIELD | Admitting: Internal Medicine

## 2018-03-21 VITALS — BP 116/78 | HR 94 | Temp 98.8°F | Wt 158.0 lb

## 2018-03-21 DIAGNOSIS — F419 Anxiety disorder, unspecified: Secondary | ICD-10-CM | POA: Diagnosis not present

## 2018-03-21 DIAGNOSIS — M6740 Ganglion, unspecified site: Secondary | ICD-10-CM

## 2018-03-21 DIAGNOSIS — Z79899 Other long term (current) drug therapy: Secondary | ICD-10-CM

## 2018-03-21 MED ORDER — ESCITALOPRAM OXALATE 5 MG PO TABS: 5.0000 mg | ORAL_TABLET | Freq: Every day | ORAL | 1 refills | Status: DC

## 2018-03-21 MED ORDER — CITALOPRAM HYDROBROMIDE 10 MG PO TABS: 5.0000 mg | ORAL_TABLET | Freq: Every day | ORAL | 1 refills | Status: DC

## 2018-03-21 NOTE — Patient Instructions (Signed)
counseling  Is very  important for helping    your anxiety .   Attention to healthy eating and  Exercise and sleep can help.   Can add low dose other med to try in addition to  Counseling to help with anxiety .    Will be contacted about  Hand cyst   appt   .     Generalized Anxiety Disorder, Adult Generalized anxiety disorder (GAD) is a mental health disorder. People with this condition constantly worry about everyday events. Unlike normal anxiety, worry related to GAD is not triggered by a specific event. These worries also do not fade or get better with time. GAD interferes with life functions, including relationships, work, and school. GAD can vary from mild to severe. People with severe GAD can have intense waves of anxiety with physical symptoms (panic attacks). What are the causes? The exact cause of GAD is not known. What increases the risk? This condition is more likely to develop in:  Women.  People who have a family history of anxiety disorders.  People who are very shy.  People who experience very stressful life events, such as the death of a loved one.  People who have a very stressful family environment. What are the signs or symptoms? People with GAD often worry excessively about many things in their lives, such as their health and family. They may also be overly concerned about:  Doing well at work.  Being on time.  Natural disasters.  Friendships. Physical symptoms of GAD include:  Fatigue.  Muscle tension or having muscle twitches.  Trembling or feeling shaky.  Being easily startled.  Feeling like your heart is pounding or racing.  Feeling out of breath or like you cannot take a deep breath.  Having trouble falling asleep or staying asleep.  Sweating.  Nausea, diarrhea, or irritable bowel syndrome (IBS).  Headaches.  Trouble concentrating or remembering facts.  Restlessness.  Irritability. How is this diagnosed? Your health care  provider can diagnose GAD based on your symptoms and medical history. You will also have a physical exam. The health care provider will ask specific questions about your symptoms, including how severe they are, when they started, and if they come and go. Your health care provider may ask you about your use of alcohol or drugs, including prescription medicines. Your health care provider may refer you to a mental health specialist for further evaluation. Your health care provider will do a thorough examination and may perform additional tests to rule out other possible causes of your symptoms. To be diagnosed with GAD, a person must have anxiety that:  Is out of his or her control.  Affects several different aspects of his or her life, such as work and relationships.  Causes distress that makes him or her unable to take part in normal activities.  Includes at least three physical symptoms of GAD, such as restlessness, fatigue, trouble concentrating, irritability, muscle tension, or sleep problems. Before your health care provider can confirm a diagnosis of GAD, these symptoms must be present more days than they are not, and they must last for six months or longer. How is this treated? The following therapies are usually used to treat GAD:  Medicine. Antidepressant medicine is usually prescribed for long-term daily control. Antianxiety medicines may be added in severe cases, especially when panic attacks occur.  Talk therapy (psychotherapy). Certain types of talk therapy can be helpful in treating GAD by providing support, education, and guidance. Options include: ?  Cognitive behavioral therapy (CBT). People learn coping skills and techniques to ease their anxiety. They learn to identify unrealistic or negative thoughts and behaviors and to replace them with positive ones. ? Acceptance and commitment therapy (ACT). This treatment teaches people how to be mindful as a way to cope with unwanted  thoughts and feelings. ? Biofeedback. This process trains you to manage your body's response (physiological response) through breathing techniques and relaxation methods. You will work with a therapist while machines are used to monitor your physical symptoms.  Stress management techniques. These include yoga, meditation, and exercise. A mental health specialist can help determine which treatment is best for you. Some people see improvement with one type of therapy. However, other people require a combination of therapies. Follow these instructions at home:  Take over-the-counter and prescription medicines only as told by your health care provider.  Try to maintain a normal routine.  Try to anticipate stressful situations and allow extra time to manage them.  Practice any stress management or self-calming techniques as taught by your health care provider.  Do not punish yourself for setbacks or for not making progress.  Try to recognize your accomplishments, even if they are small.  Keep all follow-up visits as told by your health care provider. This is important. Contact a health care provider if:  Your symptoms do not get better.  Your symptoms get worse.  You have signs of depression, such as: ? A persistently sad, cranky, or irritable mood. ? Loss of enjoyment in activities that used to bring you joy. ? Change in weight or eating. ? Changes in sleeping habits. ? Avoiding friends or family members. ? Loss of energy for normal tasks. ? Feelings of guilt or worthlessness. Get help right away if:  You have serious thoughts about hurting yourself or others. If you ever feel like you may hurt yourself or others, or have thoughts about taking your own life, get help right away. You can go to your nearest emergency department or call:  Your local emergency services (911 in the [place]  A suicide crisis helpline, such as the National Suicide Prevention Lifeline at 269 751 7387.  This is open 24 hours a day. Summary  Generalized anxiety disorder (GAD) is a mental health disorder that involves worry that is not triggered by a specific event.  People with GAD often worry excessively about many things in their lives, such as their health and family.  GAD may cause physical symptoms such as restlessness, trouble concentrating, sleep problems, frequent sweating, nausea, diarrhea, headaches, and trembling or muscle twitching.  A mental health specialist can help determine which treatment is best for you. Some people see improvement with one type of therapy. However, other people require a combination of therapies. This information is not intended to replace advice given to you by your health care provider. Make sure you discuss any questions you have with your health care provider. Document Released: 07/01/2012 Document Revised: 01/25/2016 Document Reviewed: 01/25/2016 Elsevier Interactive Patient Education  2019 [employer].

## 2018-04-05 ENCOUNTER — Other Ambulatory Visit: Payer: Self-pay | Admitting: Orthopedic Surgery

## 2018-05-19 ENCOUNTER — Other Ambulatory Visit: Payer: Self-pay | Admitting: Internal Medicine

## 2018-05-20 NOTE — Telephone Encounter (Signed)
Last filled:03/21/2018 Last OV:03/21/2018 Upcoming appt:none at this time

## 2018-05-21 ENCOUNTER — Encounter (HOSPITAL_BASED_OUTPATIENT_CLINIC_OR_DEPARTMENT_OTHER): Payer: Self-pay | Admitting: *Deleted

## 2018-05-21 ENCOUNTER — Other Ambulatory Visit: Payer: Self-pay

## 2018-05-21 NOTE — Telephone Encounter (Signed)
Have her mak fu appt  And refill x 1 until then

## 2018-05-21 NOTE — Telephone Encounter (Signed)
Pt states she will call back to make appt . 

## 2018-05-28 NOTE — Progress Notes (Signed)
Chief Complaint  Patient presents with  . Follow-up    Pt is here to follow up and get refill on birth controll     HPI: Brandy Sanchez 23 y.o. come in for med  management   Anxiety deprss  meds   Changed  to lexapro 5 mg in January   Doing much better and feels helpful   No nausea or se noted    Adapting much better to school and situation .   To have cyst surgery tomorrow.  Dr Merlyn Lot.   Due for pap never had one  Has to go back to school next week after surgery  Out in MAY  ocps doing find sometimes   Light   Spotting   Instead of whole period  No concerns . Needs refill   Prescription printed for low-dose Lexapro 5 mg she had some side effects with citalopram nausea not sure the fluoxetine gave her side effect but did not work at the low dose. She continues to begin it and follow-up with [place] after that spring break in March. Referral for right ganglion cyst that is quite large options of aspiration if appropriate or other prefers appointment on Monday or Friday because she has to commute. ROS: See pertinent positives and negatives per HPI.  Past Medical History:  Diagnosis Date  . Allergic rhinitis   . Anxiety   . Viral thyroiditis 09/09/2013    Family History  Problem Relation Age of Onset  . Thyroid disease Mother        removed due to goiter and trouble swallowing  . Hypertension Mother   . Thyroid disease Maternal Grandmother   . Diabetes Paternal Grandmother   . Thyroid disease Maternal Aunt        removed- enlarged    Social History   Socioeconomic History  . Marital status: Single    Spouse name: Not on file  . Number of children: Not on file  . Years of education: Not on file  . Highest education level: Not on file  Occupational History  . Not on file  Social Needs  . Financial resource strain: Not on file  . Food insecurity:    Worry: Not on file    Inability: Not on file  . Transportation needs:    Medical: Not on file    Non-medical: Not on  file  Tobacco Use  . Smoking status: Never Smoker  . Smokeless tobacco: Never Used  Substance and Sexual Activity  . Alcohol use: No  . Drug use: No  . Sexual activity: Never  Lifestyle  . Physical activity:    Days per week: Not on file    Minutes per session: Not on file  . Stress: Not on file  Relationships  . Social connections:    Talks on phone: Not on file    Gets together: Not on file    Attends religious service: Not on file    Active member of club or organization: Not on file    Attends meetings of clubs or organizations: Not on file    Relationship status: Not on file  Other Topics Concern  . 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 escitalopram (LEXAPRO) 5 MG tablet Take 1 tablet (5 mg total) by mouth daily. 30 tablet 1  . levonorgestrel-ethinyl estradiol (LARISSIA) 0.1-20 MG-MCG tablet Take 1 tablet by mouth daily. 84 tablet 0   No facility-administered medications prior to visit.      EXAM:  BP 126/80 (BP Location: Right Arm, Patient Position: Sitting, Cuff Size: Normal)   Pulse 75   Temp 99.3 F (37.4 C) (Oral)   Wt 156 lb 3.2 oz (70.9 kg)   LMP 04/23/2018 (Exact Date)   BMI 26.81 kg/m   Body mass index is 26.81 kg/m.  GENERAL: vitals reviewed and listed above, alert, oriented, appears well hydrated and in no acute distress HEENT: atraumatic, conjunctiva  clear, no obvious abnormalities on inspection of external nose and ears PSYCH: pleasant and cooperative, no obvious depression or anxiety more relaxed nmo panic  Not depressed   BP Readings from Last 3 Encounters:  05/29/18 126/80  03/21/18 116/78  11/15/17 132/78    ASSESSMENT AND PLAN:  Discussed the following assessment  and plan:  Medication management  Depression with anxiety  Oral contraceptive use Total visit > 50% spent counseling and coordinating care as indicated in above note and in instructions to patient .  -Patient advised to return or notify health care team  if  new concerns arise.  Patient Instructions  Glad you are doing better .  Ok to refill  Med at this time You are due for  A PAP    Make appt for  CPX and pap  For may when you are due.   Good luck with he surgery.          Neta Mends.  M.D.

## 2018-05-29 ENCOUNTER — Ambulatory Visit: Payer: BLUE CROSS/BLUE SHIELD | Admitting: Internal Medicine

## 2018-05-29 ENCOUNTER — Other Ambulatory Visit: Payer: Self-pay

## 2018-05-29 ENCOUNTER — Encounter: Payer: Self-pay | Admitting: Internal Medicine

## 2018-05-29 VITALS — BP 126/80 | HR 75 | Temp 99.3°F | Wt 156.2 lb

## 2018-05-29 DIAGNOSIS — Z3041 Encounter for surveillance of contraceptive pills: Secondary | ICD-10-CM | POA: Diagnosis not present

## 2018-05-29 DIAGNOSIS — Z79899 Other long term (current) drug therapy: Secondary | ICD-10-CM | POA: Diagnosis not present

## 2018-05-29 DIAGNOSIS — F418 Other specified anxiety disorders: Secondary | ICD-10-CM | POA: Diagnosis not present

## 2018-05-29 MED ORDER — ESCITALOPRAM OXALATE 5 MG PO TABS: 5.0000 mg | ORAL_TABLET | Freq: Every day | ORAL | 1 refills | Status: DC

## 2018-05-29 MED ORDER — LEVONORGESTREL-ETHINYL ESTRAD 0.1-20 MG-MCG PO TABS: 1.0000 | ORAL_TABLET | Freq: Every day | ORAL | 1 refills | Status: DC

## 2018-05-29 NOTE — Patient Instructions (Addendum)
Glad you are doing better .  Ok to refill  Med at this time You are due for  A PAP    Make appt for  CPX and pap  For may when you are due.   Good luck with he surgery.

## 2018-05-30 ENCOUNTER — Ambulatory Visit (HOSPITAL_BASED_OUTPATIENT_CLINIC_OR_DEPARTMENT_OTHER): Payer: BLUE CROSS/BLUE SHIELD | Admitting: Anesthesiology

## 2018-05-30 ENCOUNTER — Encounter (HOSPITAL_BASED_OUTPATIENT_CLINIC_OR_DEPARTMENT_OTHER)
Admission: RE | Disposition: A | Discharge status: Home / Self Care | Payer: Self-pay | Source: Home / Self Care | Attending: Orthopedic Surgery

## 2018-05-30 ENCOUNTER — Ambulatory Visit (HOSPITAL_BASED_OUTPATIENT_CLINIC_OR_DEPARTMENT_OTHER)
Admission: RE | Admit: 2018-05-30 | Discharge: 2018-05-30 | Disposition: A | Discharge status: Home / Self Care | Payer: BLUE CROSS/BLUE SHIELD | Attending: Orthopedic Surgery | Admitting: Orthopedic Surgery

## 2018-05-30 ENCOUNTER — Encounter (HOSPITAL_BASED_OUTPATIENT_CLINIC_OR_DEPARTMENT_OTHER): Payer: Self-pay | Admitting: *Deleted

## 2018-05-30 ENCOUNTER — Other Ambulatory Visit: Payer: Self-pay

## 2018-05-30 DIAGNOSIS — Z793 Long term (current) use of hormonal contraceptives: Secondary | ICD-10-CM | POA: Insufficient documentation

## 2018-05-30 DIAGNOSIS — F419 Anxiety disorder, unspecified: Secondary | ICD-10-CM | POA: Insufficient documentation

## 2018-05-30 DIAGNOSIS — M67431 Ganglion, right wrist: Secondary | ICD-10-CM | POA: Insufficient documentation

## 2018-05-30 DIAGNOSIS — Z79899 Other long term (current) drug therapy: Secondary | ICD-10-CM | POA: Diagnosis not present

## 2018-05-30 HISTORY — DX: Anxiety disorder, unspecified: F41.9

## 2018-05-30 HISTORY — PX: CYST EXCISION: SHX5701

## 2018-05-30 LAB — POCT PREGNANCY, URINE: Preg Test, Ur: NEGATIVE

## 2018-05-30 SURGERY — CYST REMOVAL
Anesthesia: Regional | Site: Wrist | Laterality: Right

## 2018-05-30 MED ORDER — OXYCODONE HCL 5 MG/5ML PO SOLN: 5.0000 mg | Freq: Once | ORAL | Status: AC | PRN

## 2018-05-30 MED ORDER — FENTANYL CITRATE (PF) 100 MCG/2ML IJ SOLN: 25.0000 ug | INTRAMUSCULAR | Status: DC | PRN

## 2018-05-30 MED ORDER — ONDANSETRON HCL 4 MG/2ML IJ SOLN: 4.0000 mg | Freq: Once | INTRAMUSCULAR | Status: DC | PRN

## 2018-05-30 MED ORDER — MIDAZOLAM HCL 2 MG/2ML IJ SOLN
INTRAMUSCULAR | Status: AC
  Filled 2018-05-30: qty 2

## 2018-05-30 MED ORDER — OXYCODONE HCL 5 MG PO TABS
ORAL_TABLET | ORAL | Status: AC
  Filled 2018-05-30: qty 1

## 2018-05-30 MED ORDER — CEFAZOLIN SODIUM-DEXTROSE 2-4 GM/100ML-% IV SOLN
INTRAVENOUS | Status: AC
  Filled 2018-05-30: qty 100

## 2018-05-30 MED ORDER — MIDAZOLAM HCL 2 MG/2ML IJ SOLN
1.0000 mg | INTRAMUSCULAR | Status: DC | PRN
  Administered 2018-05-30 (×2): 1 mg via INTRAVENOUS

## 2018-05-30 MED ORDER — CHLORHEXIDINE GLUCONATE 4 % EX LIQD: 60.0000 mL | Freq: Once | CUTANEOUS | Status: DC

## 2018-05-30 MED ORDER — SCOPOLAMINE 1 MG/3DAYS TD PT72: 1.0000 | MEDICATED_PATCH | Freq: Once | TRANSDERMAL | Status: DC | PRN

## 2018-05-30 MED ORDER — ONDANSETRON HCL 4 MG/2ML IJ SOLN
INTRAMUSCULAR | Status: AC
  Filled 2018-05-30: qty 2

## 2018-05-30 MED ORDER — PROPOFOL 500 MG/50ML IV EMUL
INTRAVENOUS | Status: DC | PRN
  Administered 2018-05-30: 100 ug/kg/min via INTRAVENOUS

## 2018-05-30 MED ORDER — TRAMADOL HCL 50 MG PO TABS: 50.0000 mg | ORAL_TABLET | Freq: Four times a day (QID) | ORAL | 0 refills | Status: DC | PRN

## 2018-05-30 MED ORDER — ONDANSETRON HCL 4 MG/2ML IJ SOLN
INTRAMUSCULAR | Status: DC | PRN
  Administered 2018-05-30: 4 mg via INTRAVENOUS

## 2018-05-30 MED ORDER — LACTATED RINGERS IV SOLN
INTRAVENOUS | Status: DC
  Administered 2018-05-30: 08:00:00 via INTRAVENOUS

## 2018-05-30 MED ORDER — OXYCODONE HCL 5 MG PO TABS
5.0000 mg | ORAL_TABLET | Freq: Once | ORAL | Status: AC | PRN
  Administered 2018-05-30: 5 mg via ORAL

## 2018-05-30 MED ORDER — FENTANYL CITRATE (PF) 100 MCG/2ML IJ SOLN
50.0000 ug | INTRAMUSCULAR | Status: DC | PRN
  Administered 2018-05-30: 50 ug via INTRAVENOUS

## 2018-05-30 MED ORDER — CEFAZOLIN SODIUM-DEXTROSE 2-4 GM/100ML-% IV SOLN
2.0000 g | INTRAVENOUS | Status: AC
  Administered 2018-05-30: 2 g via INTRAVENOUS

## 2018-05-30 MED ORDER — LIDOCAINE HCL (PF) 0.5 % IJ SOLN
INTRAMUSCULAR | Status: DC | PRN
  Administered 2018-05-30: 40 mL via INTRAVENOUS

## 2018-05-30 MED ORDER — BUPIVACAINE HCL (PF) 0.25 % IJ SOLN
INTRAMUSCULAR | Status: DC | PRN
  Administered 2018-05-30: 6 mL

## 2018-05-30 MED ORDER — FENTANYL CITRATE (PF) 100 MCG/2ML IJ SOLN
INTRAMUSCULAR | Status: AC
  Filled 2018-05-30: qty 2

## 2018-05-30 SURGICAL SUPPLY — 50 items
APL PRP STRL LF DISP 70% ISPRP (MISCELLANEOUS) ×1
BLADE MINI RND TIP GREEN BEAV (BLADE) IMPLANT
BLADE SURG 15 STRL LF DISP TIS (BLADE) ×1 IMPLANT
BLADE SURG 15 STRL SS (BLADE) ×2
BNDG CMPR 9X4 STRL LF SNTH (GAUZE/BANDAGES/DRESSINGS)
BNDG COHESIVE 1X5 TAN STRL LF (GAUZE/BANDAGES/DRESSINGS) IMPLANT
BNDG COHESIVE 2X5 TAN STRL LF (GAUZE/BANDAGES/DRESSINGS) IMPLANT
BNDG COHESIVE 3X5 TAN STRL LF (GAUZE/BANDAGES/DRESSINGS) ×1 IMPLANT
BNDG ESMARK 4X9 LF (GAUZE/BANDAGES/DRESSINGS) IMPLANT
BNDG GAUZE ELAST 4 BULKY (GAUZE/BANDAGES/DRESSINGS) ×1 IMPLANT
CHLORAPREP W/TINT 26 (MISCELLANEOUS) ×2 IMPLANT
CORD BIPOLAR FORCEPS 12FT (ELECTRODE) ×2 IMPLANT
COVER BACK TABLE 60X90IN (DRAPES) ×2 IMPLANT
COVER MAYO STAND STRL (DRAPES) ×2 IMPLANT
COVER WAND RF STERILE (DRAPES) IMPLANT
CUFF TOURN SGL QUICK 18X4 (TOURNIQUET CUFF) ×1 IMPLANT
DECANTER SPIKE VIAL GLASS SM (MISCELLANEOUS) IMPLANT
DRAIN PENROSE 1/2X12 LTX STRL (WOUND CARE) IMPLANT
DRAPE EXTREMITY T 121X128X90 (DISPOSABLE) ×2 IMPLANT
DRAPE SURG 17X23 STRL (DRAPES) ×2 IMPLANT
GAUZE SPONGE 4X4 12PLY STRL (GAUZE/BANDAGES/DRESSINGS) ×2 IMPLANT
GAUZE XEROFORM 1X8 LF (GAUZE/BANDAGES/DRESSINGS) ×2 IMPLANT
GLOVE BIOGEL PI IND STRL 8.5 (GLOVE) ×1 IMPLANT
GLOVE BIOGEL PI INDICATOR 8.5 (GLOVE) ×1
GLOVE SURG ORTHO 8.0 STRL STRW (GLOVE) ×2 IMPLANT
GLOVE SURG SYN 8.0 (GLOVE) ×2 IMPLANT
GLOVE SURG SYN 8.0 PF PI (GLOVE) IMPLANT
GOWN STRL REIN XL XLG (GOWN DISPOSABLE) ×1 IMPLANT
GOWN STRL REUS W/ TWL LRG LVL3 (GOWN DISPOSABLE) ×1 IMPLANT
GOWN STRL REUS W/TWL LRG LVL3 (GOWN DISPOSABLE) ×2
GOWN STRL REUS W/TWL XL LVL3 (GOWN DISPOSABLE) ×2 IMPLANT
NDL PRECISIONGLIDE 27X1.5 (NEEDLE) IMPLANT
NEEDLE PRECISIONGLIDE 27X1.5 (NEEDLE) ×2 IMPLANT
NS IRRIG 1000ML POUR BTL (IV SOLUTION) ×2 IMPLANT
PACK BASIN DAY SURGERY FS (CUSTOM PROCEDURE TRAY) ×2 IMPLANT
PAD CAST 3X4 CTTN HI CHSV (CAST SUPPLIES) IMPLANT
PADDING CAST ABS 3INX4YD NS (CAST SUPPLIES) ×1
PADDING CAST ABS 4INX4YD NS (CAST SUPPLIES) ×1
PADDING CAST ABS COTTON 3X4 (CAST SUPPLIES) IMPLANT
PADDING CAST ABS COTTON 4X4 ST (CAST SUPPLIES) ×1 IMPLANT
PADDING CAST COTTON 3X4 STRL (CAST SUPPLIES) ×2
SPLINT PLASTER CAST XFAST 3X15 (CAST SUPPLIES) IMPLANT
SPLINT PLASTER XTRA FASTSET 3X (CAST SUPPLIES) ×5
STOCKINETTE 4X48 STRL (DRAPES) ×2 IMPLANT
SUT ETHILON 4 0 PS 2 18 (SUTURE) ×2 IMPLANT
SUT VIC AB 4-0 P2 18 (SUTURE) IMPLANT
SYR BULB 3OZ (MISCELLANEOUS) ×2 IMPLANT
SYR CONTROL 10ML LL (SYRINGE) ×1 IMPLANT
TOWEL GREEN STERILE FF (TOWEL DISPOSABLE) ×4 IMPLANT
UNDERPAD 30X30 (UNDERPADS AND DIAPERS) ×2 IMPLANT

## 2018-05-30 NOTE — H&P (Signed)
Brandy Sanchez is an 23 y.o. female.   Chief Complaint:mass right wrist HPI: Brandy Sanchez to the office today with complaint of a mass in dorsal aspect of her right wrist she is referred by Dr. Vanita Panda. This been present for approximately 3 years getting larger and smaller occasionally goes away but only to return. Is causing her a mild discomfort a VAS score of 7/10 with volar flexion and a feeling of pressure in her bones. He has taken ibuprofen with little relief. Recalls no history of injury. She has no history of diabetes thyroid problems arthritis gout. Family history    Past Medical History:  Diagnosis Date  . Allergic rhinitis   . Anxiety   . Viral thyroiditis 09/09/2013    History reviewed. No pertinent surgical history.  Family History  Problem Relation Age of Onset  . Thyroid disease Mother        removed due to goiter and trouble swallowing  . Hypertension Mother   . Thyroid disease Maternal Grandmother   . Diabetes Paternal Grandmother   . Thyroid disease Maternal Aunt        removed- enlarged   Social History:  reports that she has never smoked. She has never used smokeless tobacco. She reports that she does not drink alcohol or use drugs.  Allergies: No Known Allergies  No medications prior to admission.    No results found for this or any previous visit (from the past 48 hour(s)).  No results found.   Pertinent items are noted in HPI.  Height 5\' 4"  (1.626 m), weight 72.6 kg, last menstrual period 04/23/2018.  General appearance: alert, cooperative and appears stated age Head: Normocephalic, without obvious abnormality Neck: no JVD Resp: clear to auscultation bilaterally Cardio: regular rate and rhythm, S1, S2 normal, no murmur, click, rub or gallop GI: soft, non-tender; bowel sounds normal; no masses,  no organomegaly Extremities: :mass right wrist Pulses: 2+ and symmetric Skin: Skin color, texture, turgor normal. No rashes or lesions Neurologic: Grossly  normal Incision/Wound: na  Assessment/Plan Assessment:   Ganglion of right wrist    Plan: We have discussed the etiology of this with her with her mother. We have discussed various treatment alternatives including observation anti-inflammatory splinting aspiration clysis or surgical excision along with risks and complications. She would like to have this excised. She is aware that there is no guarantee to the surgery the possibility of infection recurrence injury to arteries nerves tendons complete relief symptoms dystrophy the possibility of recurrence at approximately 10% rate. Advised to the instability which she has ligamentous laxity which may the be the underlying cause of the particular problem. She would like to have this excised. There is a scheduled as an outpatient for excision dorsal wrist ganglion right wrist.   Brandy Sanchez 05/30/2018, 5:30 AM

## 2018-05-30 NOTE — Discharge Instructions (Addendum)

## 2018-05-30 NOTE — Anesthesia Postprocedure Evaluation (Signed)
Anesthesia Post Note  Patient: Brandy Sanchez  Procedure(s) Performed: CYST REMOVAL RIGHT WRIST (Right Wrist)     Patient location during evaluation: PACU Anesthesia Type: Bier Block Level of consciousness: awake and alert Pain management: pain level controlled Vital Signs Assessment: post-procedure vital signs reviewed and stable Respiratory status: spontaneous breathing, nonlabored ventilation and respiratory function stable Cardiovascular status: blood pressure returned to baseline and stable Postop Assessment: no apparent nausea or vomiting Anesthetic complications: no    Last Vitals:  Vitals:   05/30/18 1030 05/30/18 1045  BP:  (!) 136/96  Pulse: (!) 52 62  Resp: 18 18  Temp:  36.9 C  SpO2: 100% 100%    Last Pain:  Vitals:   05/30/18 1045  TempSrc:   PainSc: 0-No pain                 Lucretia Kern

## 2018-05-30 NOTE — Anesthesia Preprocedure Evaluation (Signed)
Anesthesia Evaluation  Patient identified by MRN, date of birth, ID band Patient awake    Reviewed: Allergy & Precautions, NPO status , Patient's Chart, lab work & pertinent test results  History of Anesthesia Complications Negative for: history of anesthetic complications  Airway Mallampati: II  TM Distance: >3 FB Neck ROM: Full    Dental no notable dental hx.    Pulmonary neg pulmonary ROS,    Pulmonary exam normal        Cardiovascular negative cardio ROS Normal cardiovascular exam     Neuro/Psych negative neurological ROS  negative psych ROS   GI/Hepatic negative GI ROS, Neg liver ROS,   Endo/Other  negative endocrine ROS  Renal/GU negative Renal ROS  negative genitourinary   Musculoskeletal negative musculoskeletal ROS (+)   Abdominal   Peds  Hematology negative hematology ROS (+)   Anesthesia Other Findings   Reproductive/Obstetrics                             Anesthesia Physical Anesthesia Plan  ASA: I  Anesthesia Plan: Bier Block and Bier Block-LIDOCAINE ONLY   Post-op Pain Management:    Induction:   PONV Risk Score and Plan: 2 and Propofol infusion and Treatment may vary due to age or medical condition  Airway Management Planned: Nasal Cannula and Simple Face Mask  Additional Equipment: None  Intra-op Plan:   Post-operative Plan:   Informed Consent: I have reviewed the patients History and Physical, chart, labs and discussed the procedure including the risks, benefits and alternatives for the proposed anesthesia with the patient or authorized representative who has indicated his/her understanding and acceptance.       Plan Discussed with:   Anesthesia Plan Comments:         Anesthesia Quick Evaluation

## 2018-05-30 NOTE — Transfer of Care (Signed)
Immediate Anesthesia Transfer of Care Note  Patient: Lakecia R Hahne  Procedure(s) Performed: CYST REMOVAL RIGHT WRIST (Right )  Patient Location: PACU  Anesthesia Type:Bier block  Level of Consciousness: awake, oriented, sedated and patient cooperative  Airway & Oxygen Therapy: Patient Spontanous Breathing  Post-op Assessment: Report given to RN and Post -op Vital signs reviewed and stable  Post vital signs: Reviewed and stable  Last Vitals:  Vitals Value Taken Time  BP 128/86 05/30/2018 10:09 AM  Temp    Pulse 58 05/30/2018 10:12 AM  Resp 18 05/30/2018 10:12 AM  SpO2 100 % 05/30/2018 10:12 AM  Vitals shown include unvalidated device data.  Last Pain:  Vitals:   05/30/18 0742  TempSrc: Oral  PainSc: 0-No pain         Complications: No apparent anesthesia complications

## 2018-05-30 NOTE — Brief Op Note (Signed)
05/30/2018  10:15 AM  PATIENT:  Brandy Sanchez  23 y.o. female  PRE-OPERATIVE DIAGNOSIS:  DORSAL CYST RIGHT WRIST  POST-OPERATIVE DIAGNOSIS:  * No post-op diagnosis entered *  PROCEDURE:  Procedure(s): CYST REMOVAL RIGHT WRIST (Right)  SURGEON:  Surgeon(s) and Role:    * [name], MD - Primary  PHYSICIAN ASSISTANT:   ASSISTANTS: None ANESTHESIA:   local, regional and IV sedation  EBL:  58ml  BLOOD ADMINISTERED:none  DRAINS: none   LOCAL MEDICATIONS USED:  BUPIVICAINE   SPECIMEN:  Excision  DISPOSITION OF SPECIMEN:  PATHOLOGY  COUNTS:  YES  TOURNIQUET:  * Missing tourniquet times found for documented tourniquets in log: [id number] *  DICTATION: .Dragon Dictation  PLAN OF CARE: Discharge to home after PACU  PATIENT DISPOSITION:  PACU - hemodynamically stable.

## 2018-05-30 NOTE — Op Note (Signed)
NAME: Brandy Sanchez MEDICAL RECORD NO: [id number] DATE OF BIRTH: [birth year] FACILITY: [facility] LOCATION: [facility] PHYSICIAN: Nicki Reaper, MD   OPERATIVE REPORT   DATE OF PROCEDURE: 05/30/18    PREOPERATIVE DIAGNOSIS:   Dorsal wrist ganglion right wrist   POSTOPERATIVE DIAGNOSIS:   Same   PROCEDURE:   Excision dorsal wrist ganglion right wrist   SURGEON: [name], M.D.   ASSISTANT: none   ANESTHESIA:  Regional with sedation and Local   INTRAVENOUS FLUIDS:  Per anesthesia flow sheet.   ESTIMATED BLOOD LOSS:  Minimal.   COMPLICATIONS:  None.   SPECIMENS:   Cyst   TOURNIQUET TIME:   * Missing tourniquet times found for documented tourniquets in log: [id number] *   DISPOSITION:  Stable to PACU.   INDICATIONS: Patient is a 23 year old female with a history of a large dorsal wrist ganglion.  She is desirous of having this excised being aware of alternatives.  Pre-peri-and postoperative course been discussed along with risks and complications.  She is aware that there is no guarantee to the surgery the possibility of infection recurrence injury to arteries nerves tendons complete relief symptoms dystrophy.  In the preoperative area the patient is seen the extremity marked by both patient and surgeon antibiotic given  OPERATIVE COURSE: Patient is brought to the operating room where an upper arm IV regional anesthetic was carried out without difficulty under the direction the anesthesia department.  She was prepped using ChloraPrep in a supine position with the right arm free.  After adequate anesthesia was afforded a transverse incision was made directly over the mass dorsal aspect right wrist carried down through subcutaneous tissue bleeders were electrocauterized with bipolar neurovascular structures were otherwise identified and protected.  The cyst was immediately apparent.  A extensor retinaculum was then separated bluntly allowing visualization of the cyst was  extremely large at least 5 cm in diameter.  This was deflated.  This was then dissected free from surrounding tissue followed down to the radiocarpal joint.  Was at the scapholunate ligament area.  The cyst was excised and sent to pathology.  The area was debrided the wound irrigated and the capsule closed with interrupted 4-0 Quill sutures.  The subcutaneous tissue was closed after closure of the retinaculum with interrupted 4-0 Vicryl in both layers.  The skin was closed with subcuticular 4-0 Monocryl suture.  Benzoin Steri-Strips were applied after a local infiltration quarter percent bupivacaine without epinephrine was given approximately 7 cc was used.  A sterile compressive dressing and volar splint was applied.  Deflation of the tourniquet all fingers immediately pink.  She was taken to the recovery room for observation in satisfactory condition.  She will be discharged home to return the [facility] in 1 week on Tylenol ibuprofen with Ultram as a backup.   [name], MD Electronically signed, 05/30/18

## 2018-05-31 ENCOUNTER — Encounter (HOSPITAL_BASED_OUTPATIENT_CLINIC_OR_DEPARTMENT_OTHER): Payer: Self-pay | Admitting: Orthopedic Surgery

## 2018-08-19 ENCOUNTER — Other Ambulatory Visit: Payer: Self-pay

## 2018-08-19 MED ORDER — LEVONORGESTREL-ETHINYL ESTRAD 0.1-20 MG-MCG PO TABS: 1.0000 | ORAL_TABLET | Freq: Every day | ORAL | 1 refills | Status: DC

## 2018-09-23 ENCOUNTER — Telehealth: Payer: Self-pay | Admitting: Internal Medicine

## 2018-09-23 NOTE — Telephone Encounter (Signed)
Pt is requesting a tetanus vaccine shot I am not sure if she is due for one.

## 2018-09-24 NOTE — Telephone Encounter (Signed)
Pt has been schedule for nurse visit.

## 2018-09-24 NOTE — Telephone Encounter (Signed)
Pt is due okay to schedule for a nurse visit

## 2018-09-30 ENCOUNTER — Ambulatory Visit: Payer: BLUE CROSS/BLUE SHIELD

## 2018-10-02 ENCOUNTER — Other Ambulatory Visit: Payer: Self-pay

## 2018-10-02 ENCOUNTER — Ambulatory Visit: Payer: BC Managed Care – PPO | Admitting: *Deleted

## 2018-10-02 DIAGNOSIS — Z23 Encounter for immunization: Secondary | ICD-10-CM

## 2018-10-02 MED ORDER — TETANUS-DIPHTH-ACELL PERTUSSIS 5-2.5-18.5 LF-MCG/0.5 IM SUSP
0.5000 mL | Freq: Once | INTRAMUSCULAR | Status: AC
  Administered 2018-10-02: 11:00:00 0.5 mL via INTRAMUSCULAR

## 2018-11-28 ENCOUNTER — Other Ambulatory Visit: Payer: Self-pay | Admitting: Internal Medicine

## 2019-02-11 ENCOUNTER — Telehealth: Payer: Self-pay | Admitting: *Deleted

## 2019-02-11 ENCOUNTER — Other Ambulatory Visit: Payer: Self-pay | Admitting: Internal Medicine

## 2019-02-11 MED ORDER — LEVONORGESTREL-ETHINYL ESTRAD 0.1-20 MG-MCG PO TABS: 1.0000 | ORAL_TABLET | Freq: Every day | ORAL | 1 refills | Status: DC

## 2019-02-11 NOTE — Telephone Encounter (Signed)
Medication has been sent in 

## 2019-02-11 NOTE — Telephone Encounter (Signed)
Medication Refill - Medication: levonorgestrel-ethinyl estradiol (LARISSIA) 0.1-20 MG-MCG tablet   Has the patient contacted their pharmacy? No. (Agent: If no, request that the patient contact the pharmacy for the refill.) (Agent: If yes, when and what did the pharmacy advise?)  Preferred Pharmacy (with phone number or street name):  [id number] [address], [place] [phone] (Phone) [phone] (Fax)     Agent: Please be advised that RX refills may take up to 3 business days. We ask that you follow-up with your pharmacy.

## 2019-02-11 NOTE — Telephone Encounter (Signed)
Copied from [place] [phone]. Topic: Appointment Scheduling - Scheduling Inquiry for Clinic >> Feb 11, 2019 10:16 AM Alanda Slim E wrote: Reason for CRM: Pt would like to see Dr. Regis Bill for BV/ yeast infection today or tomorrow / please advise if she can be squeezed in

## 2019-02-11 NOTE — Telephone Encounter (Signed)
Pt has been scheduled for tomorrow. °

## 2019-02-12 ENCOUNTER — Other Ambulatory Visit (HOSPITAL_COMMUNITY)
Admission: RE | Admit: 2019-02-12 | Discharge: 2019-02-12 | Disposition: A | Discharge status: Home / Self Care | Payer: BC Managed Care – PPO | Source: Ambulatory Visit | Attending: Internal Medicine | Admitting: Internal Medicine

## 2019-02-12 ENCOUNTER — Other Ambulatory Visit: Payer: Self-pay

## 2019-02-12 ENCOUNTER — Ambulatory Visit: Payer: BC Managed Care – PPO | Admitting: Internal Medicine

## 2019-02-12 ENCOUNTER — Encounter: Payer: Self-pay | Admitting: Internal Medicine

## 2019-02-12 VITALS — BP 120/62 | HR 95 | Temp 98.0°F | Wt 161.4 lb

## 2019-02-12 DIAGNOSIS — N898 Other specified noninflammatory disorders of vagina: Secondary | ICD-10-CM | POA: Insufficient documentation

## 2019-02-12 MED ORDER — METRONIDAZOLE 500 MG PO TABS: 500.0000 mg | ORAL_TABLET | Freq: Two times a day (BID) | ORAL | 0 refills | Status: DC

## 2019-02-12 NOTE — Patient Instructions (Signed)
Seems like BV  Treat for this  And await the results.   Bacterial Vaginosis  Bacterial vaginosis is a vaginal infection that occurs when the normal balance of bacteria in the vagina is disrupted. It results from an overgrowth of certain bacteria. This is the most common vaginal infection among women ages 59-44. Because bacterial vaginosis increases your risk for STIs (sexually transmitted infections), getting treated can help reduce your risk for chlamydia, gonorrhea, herpes, and HIV (human immunodeficiency virus). Treatment is also important for preventing complications in pregnant women, because this condition can cause an early (premature) delivery. What are the causes? This condition is caused by an increase in harmful bacteria that are normally present in small amounts in the vagina. However, the reason that the condition develops is not fully understood. What increases the risk? The following factors may make you more likely to develop this condition:  Having a new sexual partner or multiple sexual partners.  Having unprotected sex.  Douching.  Having an intrauterine device (IUD).  Smoking.  Drug and alcohol abuse.  Taking certain antibiotic medicines.  Being pregnant. You cannot get bacterial vaginosis from toilet seats, bedding, swimming pools, or contact with objects around you. What are the signs or symptoms? Symptoms of this condition include:  Grey or white vaginal discharge. The discharge can also be watery or foamy.  A fish-like odor with discharge, especially after sexual intercourse or during menstruation.  Itching in and around the vagina.  Burning or pain with urination. Some women with bacterial vaginosis have no signs or symptoms. How is this diagnosed? This condition is diagnosed based on:  Your medical history.  A physical exam of the vagina.  Testing a sample of vaginal fluid under a microscope to look for a large amount of bad bacteria or  abnormal cells. Your health care provider may use a cotton swab or a small wooden spatula to collect the sample. How is this treated? This condition is treated with antibiotics. These may be given as a pill, a vaginal cream, or a medicine that is put into the vagina (suppository). If the condition comes back after treatment, a second round of antibiotics may be needed. Follow these instructions at home: Medicines  Take over-the-counter and prescription medicines only as told by your health care provider.  Take or use your antibiotic as told by your health care provider. Do not stop taking or using the antibiotic even if you start to feel better. General instructions  If you have a female sexual partner, tell her that you have a vaginal infection. She should see her health care provider and be treated if she has symptoms. If you have a female sexual partner, he does not need treatment.  During treatment: ? Avoid sexual activity until you finish treatment. ? Do not douche. ? Avoid alcohol as directed by your health care provider. ? Avoid breastfeeding as directed by your health care provider.  Drink enough water and fluids to keep your urine clear or pale yellow.  Keep the area around your vagina and rectum clean. ? Wash the area daily with warm water. ? Wipe yourself from front to back after using the toilet.  Keep all follow-up visits as told by your health care provider. This is important. How is this prevented?  Do not douche.  Wash the outside of your vagina with warm water only.  Use protection when having sex. This includes latex condoms and dental dams.  Limit how many sexual partners you have. To  help prevent bacterial vaginosis, it is best to have sex with just one partner (monogamous).  Make sure you and your sexual partner are tested for STIs.  Wear cotton or cotton-lined underwear.  Avoid wearing tight pants and pantyhose, especially during summer.  Limit the amount  of alcohol that you drink.  Do not use any products that contain nicotine or tobacco, such as cigarettes and e-cigarettes. If you need help quitting, ask your health care provider.  Do not use illegal drugs. Where to find more information  Centers for Disease Control and Prevention: SolutionApps.co.za  American Sexual Health Association (ASHA): www.ashastd.org  U.S. Department of Health and Health and safety inspector, Office on Women's Health: ConventionalMedicines.si or http://www.anderson-williamson.info/ Contact a health care provider if:  Your symptoms do not improve, even after treatment.  You have more discharge or pain when urinating.  You have a fever.  You have pain in your abdomen.  You have pain during sex.  You have vaginal bleeding between periods. Summary  Bacterial vaginosis is a vaginal infection that occurs when the normal balance of bacteria in the vagina is disrupted.  Because bacterial vaginosis increases your risk for STIs (sexually transmitted infections), getting treated can help reduce your risk for chlamydia, gonorrhea, herpes, and HIV (human immunodeficiency virus). Treatment is also important for preventing complications in pregnant women, because the condition can cause an early (premature) delivery.  This condition is treated with antibiotic medicines. These may be given as a pill, a vaginal cream, or a medicine that is put into the vagina (suppository). This information is not intended to replace advice given to you by your health care provider. Make sure you discuss any questions you have with your health care provider. Document Released: 03/06/2005 Document Revised: 02/16/2017 Document Reviewed: 11/20/2015 Elsevier Patient Education  2020 [employer].

## 2019-02-12 NOTE — Progress Notes (Signed)
Chief Complaint  Patient presents with  . vaginal odor    Pt states no discharge but notices odor after urinating and sex no other symptoms    This visit occurred during the SARS-CoV-2 public health emergency.  Safety protocols were in place, including screening questions prior to the visit, additional usage of staff PPE, and extensive cleaning of exam room while observing appropriate contact time as indicated for disinfecting solutions.    HPI: Brandy Sanchez 23 y.o. come in for  Vag dc thinking BV   For 3 weeks   Local hygiene  Periods  October  [employer].  But on time  On ocps . Some of the time  Condoms .   1 partner monogamous  [facility]  [facility]? Last week  last week they did a urine sample and given diflucan  And no help.  Had UA. For screening pending  Has bad odor after ic and also  Her and there but no dc noted no abd pain fever  ROS: See pertinent positives and negatives per HPI. [place]  [place]  In school to go back  aftter holiday.  Past Medical History:  Diagnosis Date  . Allergic rhinitis   . Anxiety   . Viral thyroiditis 09/09/2013    Family History  Problem Relation Age of Onset  . Thyroid disease Mother        removed due to goiter and trouble swallowing  . Hypertension Mother   . Thyroid disease Maternal Grandmother   . Diabetes Paternal Grandmother   . Thyroid disease Maternal Aunt        removed- enlarged    Social History   Socioeconomic History  . Marital status: Single    Spouse name: Not on file  . Number of children: Not on file  . Years of education: Not on file  . Highest education level: Not on file  Occupational History  . Not on file  Social Needs  . Financial resource strain: Not on file  . Food insecurity    Worry: Not on file    Inability: Not on file  . Transportation needs    Medical: Not on file    Non-medical: Not on file  Tobacco Use  . Smoking status: Never Smoker  . Smokeless tobacco: Never Used  Substance and Sexual  Activity  . Alcohol use: No  . Drug use: No  . Sexual activity: Never  Lifestyle  . Physical activity    Days per week: Not on file    Minutes per session: Not on file  . Stress: Not on file  Relationships  . Social Musician on phone: Not on file    Gets together: Not on file    Attends religious service: Not on file    Active member of club or organization: Not on file    Attends meetings of clubs or organizations: Not on file    Relationship status: Not on file  Other Topics Concern  . 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 escitalopram (LEXAPRO) 5  MG tablet Take 1 tablet (5 mg total) by mouth daily. 90 tablet 1  . levonorgestrel-ethinyl estradiol (LARISSIA) 0.1-20 MG-MCG tablet Take 1 tablet by mouth daily. 84 tablet 1  . traMADol (ULTRAM) 50 MG tablet Take 1 tablet (50 mg total) by mouth every 6 (six) hours as needed. (Patient not taking: Reported on 02/12/2019) 20 tablet 0   No facility-administered medications prior to visit.      EXAM:  BP 120/62 (BP Location: Right Arm, Patient Position: Sitting, Cuff Size: Normal)   Pulse 95   Temp 98 F (36.7 C) (Temporal)   Wt 161 lb 6.4 oz (73.2 kg)   SpO2 98%   BMI 27.70 kg/m   Body mass index is 27.7 kg/m.  GENERAL: vitals reviewed and listed above, alert, oriented, appears well hydrated and in no acute distress HEENT: atraumatic, conjunctiva  clear, no obvious abnormalities on inspection of external nose and ears OP :masked   NECK: no obvious masses on inspection palpation  MS: moves all extremities without noticeable focal  Abnormality Ext gu nl  No lesions  Ulcers or rredness   cx oss clear some mucoid clear  And creamy dc white ? Grey   Some fishy odor  aptima  Swab sent  PSYCH: pleasant and cooperative, no obvious depression or anxiety Lab Results  Component Value Date   WBC 4.5 07/30/2017   HGB 13.0 07/30/2017   HCT 38.6 07/30/2017   PLT 417.0 (H) 07/30/2017   GLUCOSE 78 07/30/2017   CHOL 123 07/30/2017   TRIG 44.0 07/30/2017   HDL 48.10 07/30/2017   LDLCALC 66 07/30/2017   ALT 25 07/30/2017   AST 35 07/30/2017   NA 139 07/30/2017   K 4.2 07/30/2017   CL 107 07/30/2017   CREATININE 0.87 07/30/2017   BUN 8 07/30/2017   CO2 26 07/30/2017   TSH 0.78 07/30/2017   BP Readings from Last 3 Encounters:  02/12/19 120/62  05/30/18 (!) 136/96  05/29/18 126/80    ASSESSMENT AND PLAN:  Discussed the following assessment and plan:  Vaginal odor - Plan: Cervicovaginal ancillary only Most likely bv based in exam and hx and previous tesing and failure of  Diflucan  Disc diff dx  Empiric rx for   bv and results when avialbe   -Patient advised to return or notify health care team  if  new concerns arise.  Patient Instructions  Seems like BV  Treat for this  And await the results.   Bacterial Vaginosis  Bacterial vaginosis is a vaginal infection that occurs when the normal balance of bacteria in the vagina is disrupted. It results from an overgrowth of certain bacteria. This is the most common vaginal infection among women ages 6215-44. Because bacterial vaginosis increases your risk for STIs (sexually transmitted infections), getting treated can help reduce your risk for chlamydia, gonorrhea, herpes, and HIV (human immunodeficiency virus). Treatment is also important for preventing complications in pregnant women, because this condition can cause an early (premature) delivery. What are the causes? This condition is caused by an increase in harmful bacteria that are normally present in small amounts in the vagina. However, the reason that the condition develops is not fully understood. What increases the risk? The  following factors may make you more likely to develop this condition:  Having a new sexual partner or multiple sexual partners.  Having unprotected sex.  Douching.  Having an intrauterine device (IUD).  Smoking.  Drug and alcohol abuse.  Taking certain antibiotic medicines.  Being pregnant. You cannot get bacterial vaginosis from toilet seats, bedding, swimming pools, or contact with objects around you. What are the signs or symptoms? Symptoms of this condition include:  Grey or white vaginal discharge. The discharge can also be watery or foamy.  A fish-like odor with discharge, especially after sexual intercourse or during menstruation.  Itching in and around the vagina.  Burning or pain with urination. Some women with bacterial vaginosis have no signs or symptoms. How is this diagnosed? This condition is diagnosed based on:  Your medical history.  A physical exam of the vagina.  Testing a sample of vaginal fluid under a microscope to look for a large amount of bad bacteria or abnormal cells. Your health care provider may use a cotton swab or a small wooden spatula to collect the sample. How is this treated? This condition is treated with antibiotics. These may be given as a pill, a vaginal cream, or a medicine that is put into the vagina (suppository). If the condition comes back after treatment, a second round of antibiotics may be needed. Follow these instructions at home: Medicines  Take over-the-counter and prescription medicines only as told by your health care provider.  Take or use your antibiotic as told by your health care provider. Do not stop taking or using the antibiotic even if you start to feel better. General instructions  If you have a female sexual partner, tell her that you have a vaginal infection. She should see her health care provider and be treated if she has symptoms. If you have a female sexual partner, he does not need treatment.  During  treatment: ? Avoid sexual activity until you finish treatment. ? Do not douche. ? Avoid alcohol as directed by your health care provider. ? Avoid breastfeeding as directed by your health care provider.  Drink enough water and fluids to keep your urine clear or pale yellow.  Keep the area around your vagina and rectum clean. ? Wash the area daily with warm water. ? Wipe yourself from front to back after using the toilet.  Keep all follow-up visits as told by your health care provider. This is important. How is this prevented?  Do not douche.  Wash the outside of your vagina with warm water only.  Use protection when having sex. This includes latex condoms and dental dams.  Limit how many sexual partners you have. To help prevent bacterial vaginosis, it is best to have sex with just one partner (monogamous).  Make sure you and your sexual partner are tested for STIs.  Wear cotton or cotton-lined underwear.  Avoid wearing tight pants and pantyhose, especially during summer.  Limit the amount of alcohol that you drink.  Do not use any products that contain nicotine or tobacco, such as cigarettes and e-cigarettes. If you need help quitting, ask your health care provider.  Do not use illegal drugs. Where to find more information  Centers for Disease Control and Prevention: AppraiserFraud.fi  American Sexual Health Association (ASHA): www.ashastd.org  U.S. Department of Health and Financial controller, Office on Women's Health: DustingSprays.pl or SecuritiesCard.it Contact a health care provider if:  Your symptoms do not improve, even after treatment.  You have more discharge or pain when urinating.  You have a fever.  You have pain in your abdomen.  You have pain during sex.  You have vaginal bleeding between periods. Summary  Bacterial vaginosis is a vaginal infection that occurs when the normal balance of bacteria in the  vagina is disrupted.  Because bacterial vaginosis increases your risk for STIs (sexually transmitted infections), getting treated can help reduce your risk for chlamydia, gonorrhea, herpes, and HIV (human immunodeficiency virus). Treatment is also important for preventing complications in pregnant women, because the condition can cause an early (premature) delivery.  This condition is treated with antibiotic medicines. These may be given as a pill, a vaginal cream, or a medicine that is put into the vagina (suppository). This information is not intended to replace advice given to you by your health care provider. Make sure you discuss any questions you have with your health care provider. Document Released: 03/06/2005 Document Revised: 02/16/2017 Document Reviewed: 11/20/2015 Elsevier Patient Education  2020 [employer].     [place] K. Dason Mosley M.D.

## 2019-02-14 LAB — CERVICOVAGINAL ANCILLARY ONLY
Bacterial Vaginitis (gardnerella): POSITIVE — AB
Candida Glabrata: NEGATIVE
Candida Vaginitis: NEGATIVE
Chlamydia: NEGATIVE
Comment: NEGATIVE
Comment: NEGATIVE
Comment: NEGATIVE
Comment: NEGATIVE
Comment: NEGATIVE
Comment: NORMAL
Neisseria Gonorrhea: NEGATIVE
Trichomonas: NEGATIVE

## 2019-02-17 ENCOUNTER — Ambulatory Visit: Payer: BC Managed Care – PPO | Admitting: Internal Medicine

## 2019-09-17 ENCOUNTER — Ambulatory Visit: Payer: BC Managed Care – PPO | Admitting: Internal Medicine

## 2019-10-05 ENCOUNTER — Other Ambulatory Visit: Payer: Self-pay | Admitting: Internal Medicine

## 2019-10-07 NOTE — Telephone Encounter (Signed)
Last OV 02/12/19  Patient is due for PAP. Needs an appointment.

## 2019-10-24 ENCOUNTER — Other Ambulatory Visit: Payer: Self-pay

## 2019-10-24 MED ORDER — LEVONORGESTREL-ETHINYL ESTRAD 0.1-20 MG-MCG PO TABS: 1.0000 | ORAL_TABLET | Freq: Every day | ORAL | 0 refills | Status: DC

## 2019-10-27 NOTE — Progress Notes (Signed)
Chief Complaint  Patient presents with  . Medication Refill    Doing well    HPI: Patient  Brandy Sanchez  24 y.o. comes in today for fu meds and concern about tiredness   Feels blood could be low    Tired  Not in [place] . Until recently but now at home and feeling a lot better mood wise  Get checked for anemia.  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 Looking   OCP  Not as heavy spotting. On time     doing ok   I partner in [place]  hasn't had a pap yet .   Came home to live in May .  To finish studies  On line and graduate in 31-Mar-2023.    Stopped taking lexapro .    Feeling better. Much happier and relieved   Stopped counseling .  Did have covid vaccine moderna    Health Maintenance  Topic Date Due  . Hepatitis C Screening  Never done  . PAP-Cervical Cytology Screening  Never done  . PAP SMEAR-Modifier  Never done  . INFLUENZA VACCINE  10/19/2019  . TETANUS/TDAP  10/01/2028  . COVID-19 Vaccine  Completed  . HIV Screening  Completed   Health Maintenance Review LIFESTYLE:  Exercise:  yes Tobacco/ETS:n Alcohol: n Sugar beverages:n Sleep:  At least  6   Hours   Tend to go to bed late and up at 7 730  Drug use: no    ROS:  See hpi  GEN/ HEENT: No fever, significant weight changes sweats headaches vision problems hearing changes, CV/ PULM; No chest pain shortness of breath cough, syncope,edema  change in exercise tolerance. GI /GU: No adominal pain, vomiting, change in bowel habits. No blood in the stool. No significant GU symptoms. SKIN/HEME: ,no acute skin rashes suspicious lesions or bleeding. No lymphadenopathy, nodules, masses.  NEURO/ PSYCH:  No neurologic signs such as weakness numbness. No depression anxiety. IMM/ Allergy: No unusual infections.  Allergy .   REST of 12 system review negative except as per HPI   Past Medical History:  Diagnosis Date  . Allergic rhinitis   . Anxiety   . Viral thyroiditis 09/09/2013    Past Surgical History:    Procedure Laterality Date  . CYST EXCISION Right 05/30/2018   Procedure: CYST REMOVAL RIGHT WRIST;  Surgeon: [name], MD;  Location: [facility];  Service: Orthopedics;  Laterality: Right;    Family History  Problem Relation Age of Onset  . Thyroid disease Mother        removed due to goiter and trouble swallowing  . Hypertension Mother   . Thyroid disease Maternal Grandmother   . Diabetes Paternal Grandmother   . Thyroid disease Maternal Aunt        removed- enlarged    Social History   Socioeconomic History  . Marital status: Single    Spouse name: Not on file  . Number of children: Not on file  . Years of education: Not on file  . Highest education level: Not on file  Occupational History  . Not on file  Tobacco Use  . Smoking status: Never Smoker  . Smokeless tobacco: Never Used  Vaping Use  . Vaping Use: Never used  Substance and Sexual Activity  . Alcohol use: No  . Drug use: No  . Sexual activity: Yes    Birth control/protection: Pill  Other Topics Concern  . Not on file  Social History Narrative  Intact family   HH of 4  Lives with parents and 1 sister        grimsley  A and bs  At [employer] in public health --    Spanish immersion in Biochemist, clinical and softball    Works hh   Back at home to graduate in dec 21      Social Determinants of Health   Financial Resource Strain:   . Difficulty of Paying Living Expenses:   Food Insecurity:   . Worried About Programme researcher, broadcasting/film/video in the Last Year:   . Barista in the Last Year:   Transportation Needs:   . Freight forwarder (Medical):   Marland Kitchen Lack of Transportation (Non-Medical):   Physical Activity:   . Days of Exercise per Week:   . Minutes of Exercise per Session:   Stress:   . Feeling of Stress :   Social Connections:   . Frequency of Communication with Friends and Family:   . Frequency of Social Gatherings with Friends and Family:   . Attends Religious Services:    . Active Member of Clubs or Organizations:   . Attends Banker Meetings:   Marland Kitchen Marital Status:     Outpatient Medications Prior to Visit  Medication Sig Dispense Refill  . ibuprofen (ADVIL) 200 MG tablet Take by mouth.    . levonorgestrel-ethinyl estradiol (LARISSIA) 0.1-20 MG-MCG tablet Take 1 tablet by mouth daily. 84 tablet 0  . escitalopram (LEXAPRO) 5 MG tablet Take 1 tablet (5 mg total) by mouth daily. (Patient not taking: Reported on 10/28/2019) 90 tablet 1  . metroNIDAZOLE (FLAGYL) 500 MG tablet Take 1 tablet (500 mg total) by mouth 2 (two) times daily. (Patient not taking: Reported on 10/28/2019) 14 tablet 0   No facility-administered medications prior to visit.     EXAM:  BP 128/68   Pulse 87   Temp 98.6 F (37 C) (Oral)   Ht 5\' 3"  (1.6 m)   Wt 159 lb 3.2 oz (72.2 kg)   SpO2 99%   BMI 28.20 kg/m   Body mass index is 28.2 kg/m. Wt Readings from Last 3 Encounters:  10/28/19 159 lb 3.2 oz (72.2 kg)  02/12/19 161 lb 6.4 oz (73.2 kg)  05/30/18 156 lb 1.4 oz (70.8 kg)    Physical Exam: Vital signs reviewed 07/30/18 is a well-developed well-nourished alert cooperative    who appearsr stated age in no acute distress.  HEENT: normocephalic atraumatic , Eyes: PERRL EOM's full, conjunctiva clear, Nares: paten,t no deformity discharge or tenderness., Ears: no deformity EAC's clear TMs with normal landmarks. Mouth: left lower mandib ranula .  Moist mucous membranes. Dentition in adequate repair. NECK: supple thyroid palpable no nodules  CHEST/PULM:  Clear to auscultation and percussion breath sounds equal no wheeze , rales or rhonchi. No chest wall deformities or tenderness. CV: PMI is nondisplaced, S1 S2 no gallops, murmurs, rubs. Peripheral pulses are full without delay.No JVD .  ABDOMEN: Bowel sounds normal nontender  No guard or rebound, no hepato splenomegal no CVA tenderness.  No hernia. Extremtities:  No clubbing cyanosis or edema, no acute joint swelling or  redness no focal atrophy NEURO:  Oriented x3, cranial nerves 3-12 appear to be intact, no obvious focal weakness,gait within normal limits no abnormal reflexes or asymmetrical SKIN: No acute rashes normal turgor, color, no bruising or petechiae. PSYCH: Oriented, good eye contact, no obvious depression anxiety, cognition and judgment appear normal. LN:  no cervical axillary inguinal adenopathy  Lab Results  Component Value Date   WBC 4.5 07/30/2017   HGB 13.0 07/30/2017   HCT 38.6 07/30/2017   PLT 417.0 (H) 07/30/2017   GLUCOSE 78 07/30/2017   CHOL 123 07/30/2017   TRIG 44.0 07/30/2017   HDL 48.10 07/30/2017   LDLCALC 66 07/30/2017   ALT 25 07/30/2017   AST 35 07/30/2017   NA 139 07/30/2017   K 4.2 07/30/2017   CL 107 07/30/2017   CREATININE 0.87 07/30/2017   BUN 8 07/30/2017   CO2 26 07/30/2017   TSH 0.78 07/30/2017    BP Readings from Last 3 Encounters:  10/28/19 128/68  02/12/19 120/62  05/30/18 (!) 136/96   Ate eggs bacon and wrap this am  Lab plan  reviewed with patient   ASSESSMENT AND PLAN:  Discussed the following assessment and plan:    ICD-10-CM   1. Feeling tired  R53.83 Basic metabolic panel    CBC with Differential/Platelet    Hepatic function panel    Lipid panel    TSH    T4, free    RPR    HIV Antibody (routine testing w rflx)    Urine cytology ancillary only    Urine cytology ancillary only    HIV Antibody (routine testing w rflx)    RPR    T4, free    TSH    Lipid panel    Hepatic function panel    CBC with Differential/Platelet    Basic metabolic panel  2. Medication management  Z79.899 Basic metabolic panel    CBC with Differential/Platelet    Hepatic function panel    Lipid panel    TSH    T4, free    T4, free    TSH    Lipid panel    Hepatic function panel    CBC with Differential/Platelet    Basic metabolic panel  3. Routine screening for STI (sexually transmitted infection)  Z11.3 RPR    HIV Antibody (routine testing w  rflx)    Urine cytology ancillary only    Urine cytology ancillary only    HIV Antibody (routine testing w rflx)    RPR  4. Oral contraceptive use  Z30.41 Basic metabolic panel    CBC with Differential/Platelet    Hepatic function panel    Lipid panel    TSH    T4, free    RPR    HIV Antibody (routine testing w rflx)    Urine cytology ancillary only    Urine cytology ancillary only    HIV Antibody (routine testing w rflx)    RPR    T4, free    TSH    Lipid panel    Hepatic function panel    CBC with Differential/Platelet    Basic metabolic panel  fatigue poss from sleep patterns not pathologic  Mood much better  Acceptable candidate for ocps to continue. Need well women exam and pap   To schedule  Ok to refill meds when due  Ok to stay off of lexapro  Since doing much better  Return for Well women cpx and pap  appt .  Patient Care Team: Madelin Headings, MD as PCP - General Patient Instructions  checking lab and sti screening Work on getting more sleep  Dark or black out  [place] in am  Limit screen time in evenings .     Neta Mends. Malic Rosten M.D.

## 2019-10-28 ENCOUNTER — Encounter: Payer: Self-pay | Admitting: Internal Medicine

## 2019-10-28 ENCOUNTER — Ambulatory Visit: Payer: BC Managed Care – PPO | Admitting: Internal Medicine

## 2019-10-28 ENCOUNTER — Other Ambulatory Visit: Payer: Self-pay

## 2019-10-28 ENCOUNTER — Other Ambulatory Visit (HOSPITAL_COMMUNITY)
Admission: RE | Admit: 2019-10-28 | Discharge: 2019-10-28 | Disposition: A | Discharge status: Home / Self Care | Payer: BC Managed Care – PPO | Source: Ambulatory Visit | Attending: Internal Medicine | Admitting: Internal Medicine

## 2019-10-28 VITALS — BP 128/68 | HR 87 | Temp 98.6°F | Ht 63.0 in | Wt 159.2 lb

## 2019-10-28 DIAGNOSIS — Z79899 Other long term (current) drug therapy: Secondary | ICD-10-CM | POA: Diagnosis not present

## 2019-10-28 DIAGNOSIS — R5383 Other fatigue: Secondary | ICD-10-CM

## 2019-10-28 DIAGNOSIS — Z113 Encounter for screening for infections with a predominantly sexual mode of transmission: Secondary | ICD-10-CM | POA: Insufficient documentation

## 2019-10-28 DIAGNOSIS — Z3041 Encounter for surveillance of contraceptive pills: Secondary | ICD-10-CM

## 2019-10-28 NOTE — Patient Instructions (Signed)
checking lab and sti screening Work on getting more sleep  Dark or black out  Curtains in am  Limit screen time in evenings .

## 2019-10-29 LAB — CBC WITH DIFFERENTIAL/PLATELET
Absolute Monocytes: 320 cells/uL (ref 200–950)
Basophils Absolute: 30 cells/uL (ref 0–200)
Basophils Relative: 0.9 %
Eosinophils Absolute: 152 cells/uL (ref 15–500)
Eosinophils Relative: 4.6 %
HCT: 39.8 % (ref 35.0–45.0)
Hemoglobin: 13.4 g/dL (ref 11.7–15.5)
Lymphs Abs: 1535 cells/uL (ref 850–3900)
MCH: 30.5 pg (ref 27.0–33.0)
MCHC: 33.7 g/dL (ref 32.0–36.0)
MCV: 90.7 fL (ref 80.0–100.0)
MPV: 9.4 fL (ref 7.5–12.5)
Monocytes Relative: 9.7 %
Neutro Abs: 1264 cells/uL — ABNORMAL LOW (ref 1500–7800)
Neutrophils Relative %: 38.3 %
Platelets: 358 10*3/uL (ref 140–400)
RBC: 4.39 10*6/uL (ref 3.80–5.10)
RDW: 11.9 % (ref 11.0–15.0)
Total Lymphocyte: 46.5 %
WBC: 3.3 10*3/uL — ABNORMAL LOW (ref 3.8–10.8)

## 2019-10-29 LAB — HEPATIC FUNCTION PANEL
AG Ratio: 1.4 (calc) (ref 1.0–2.5)
ALT: 24 U/L (ref 6–29)
AST: 23 U/L (ref 10–30)
Albumin: 4.2 g/dL (ref 3.6–5.1)
Alkaline phosphatase (APISO): 58 U/L (ref 31–125)
Bilirubin, Direct: 0.1 mg/dL (ref 0.0–0.2)
Globulin: 2.9 g/dL (calc) (ref 1.9–3.7)
Indirect Bilirubin: 0.5 mg/dL (calc) (ref 0.2–1.2)
Total Bilirubin: 0.6 mg/dL (ref 0.2–1.2)
Total Protein: 7.1 g/dL (ref 6.1–8.1)

## 2019-10-29 LAB — HIV ANTIBODY (ROUTINE TESTING W REFLEX): HIV 1&2 Ab, 4th Generation: NONREACTIVE

## 2019-10-29 LAB — BASIC METABOLIC PANEL
BUN: 12 mg/dL (ref 7–25)
CO2: 27 mmol/L (ref 20–32)
Calcium: 9.4 mg/dL (ref 8.6–10.2)
Chloride: 105 mmol/L (ref 98–110)
Creat: 0.95 mg/dL (ref 0.50–1.10)
Glucose, Bld: 77 mg/dL (ref 65–99)
Potassium: 4.1 mmol/L (ref 3.5–5.3)
Sodium: 138 mmol/L (ref 135–146)

## 2019-10-29 LAB — LIPID PANEL
Cholesterol: 131 mg/dL (ref <200)
HDL: 56 mg/dL (ref >=50)
LDL Cholesterol (Calc): 62 mg/dL (calc)
Non-HDL Cholesterol (Calc): 75 mg/dL (calc) (ref <130)
Total CHOL/HDL Ratio: 2.3 (calc) (ref <5.0)
Triglycerides: 51 mg/dL (ref <150)

## 2019-10-29 LAB — URINE CYTOLOGY ANCILLARY ONLY
Chlamydia: NEGATIVE
Comment: NEGATIVE
Comment: NORMAL
Neisseria Gonorrhea: NEGATIVE

## 2019-10-29 LAB — RPR: RPR Ser Ql: NONREACTIVE

## 2019-10-29 LAB — TSH: TSH: 0.45 mIU/L

## 2019-10-29 LAB — T4, FREE: Free T4: 1.2 ng/dL (ref 0.8–1.8)

## 2019-10-30 NOTE — Progress Notes (Signed)
No anemia  and sugar and  thyroid normal  Sti screen  is negative . Make sure you get your  pap smear well women  appt

## 2020-01-12 ENCOUNTER — Other Ambulatory Visit: Payer: Self-pay

## 2020-01-13 MED ORDER — LEVONORGESTREL-ETHINYL ESTRAD 0.1-20 MG-MCG PO TABS: 1.0000 | ORAL_TABLET | Freq: Every day | ORAL | 0 refills | Status: DC

## 2020-01-16 ENCOUNTER — Other Ambulatory Visit: Payer: Self-pay | Admitting: Internal Medicine

## 2020-06-08 ENCOUNTER — Other Ambulatory Visit: Payer: Self-pay | Admitting: Internal Medicine

## 2020-06-08 NOTE — Telephone Encounter (Signed)
Next med management appointment is 06/14/2020.    Can this patient receive a refill?

## 2020-06-11 ENCOUNTER — Other Ambulatory Visit: Payer: Self-pay

## 2020-06-14 ENCOUNTER — Ambulatory Visit: Payer: BC Managed Care – PPO | Admitting: Internal Medicine

## 2020-06-14 ENCOUNTER — Other Ambulatory Visit: Payer: Self-pay

## 2020-06-14 ENCOUNTER — Other Ambulatory Visit (HOSPITAL_COMMUNITY)
Admission: RE | Admit: 2020-06-14 | Discharge: 2020-06-14 | Disposition: A | Discharge status: Home / Self Care | Payer: BC Managed Care – PPO | Source: Ambulatory Visit | Attending: Internal Medicine | Admitting: Internal Medicine

## 2020-06-14 ENCOUNTER — Encounter: Payer: Self-pay | Admitting: Internal Medicine

## 2020-06-14 VITALS — BP 110/70 | HR 71 | Temp 98.8°F | Ht 63.0 in | Wt 172.2 lb

## 2020-06-14 DIAGNOSIS — Z01419 Encounter for gynecological examination (general) (routine) without abnormal findings: Secondary | ICD-10-CM

## 2020-06-14 DIAGNOSIS — A5901 Trichomonal vulvovaginitis: Secondary | ICD-10-CM | POA: Diagnosis not present

## 2020-06-14 DIAGNOSIS — Z124 Encounter for screening for malignant neoplasm of cervix: Secondary | ICD-10-CM | POA: Insufficient documentation

## 2020-06-14 DIAGNOSIS — Z3041 Encounter for surveillance of contraceptive pills: Secondary | ICD-10-CM | POA: Diagnosis not present

## 2020-06-14 DIAGNOSIS — Z0000 Encounter for general adult medical examination without abnormal findings: Secondary | ICD-10-CM | POA: Diagnosis not present

## 2020-06-14 DIAGNOSIS — Z113 Encounter for screening for infections with a predominantly sexual mode of transmission: Secondary | ICD-10-CM | POA: Insufficient documentation

## 2020-06-14 NOTE — Progress Notes (Signed)
Chief Complaint  Patient presents with  . Contraception  . Anxiety    HPI: Patient  Brandy Sanchez  25 y.o. comes in today for Preventive Health Care visit   No change in health exercising more  Seems to be coping ok with increase exercise going to t he gym Had labs done in august   Health Maintenance  Topic Date Due  . Hepatitis C Screening  Never done  . PAP-Cervical Cytology Screening  Never done  . PAP SMEAR-Modifier  Never done  . TETANUS/TDAP  10/01/2028  . INFLUENZA VACCINE  Completed  . HPV VACCINES  Completed  . COVID-19 Vaccine  Completed  . HIV Screening  Completed   Health Maintenance Review LIFESTYLE:  Exercise:   5 days per week.  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  continuous   Usually period about every 3 mos    1 partner 4 years.   utd on   covid iummunization Muscle recovery supplements  After working out.    ROS:  GEN/ HEENT: No fever, significant weight changes sweats headaches vision problems hearing changes, CV/ PULM; No chest pain shortness of breath cough, syncope,edema  change in exercise tolerance. GI /GU: No adominal pain, vomiting, change in bowel habits. No blood in the stool. No significant GU symptoms. SKIN/HEME: ,no acute skin rashes suspicious lesions or bleeding. No lymphadenopathy, nodules, masses.  NEURO/ PSYCH:  No neurologic signs such as weakness numbness. No depression anxiety. IMM/ Allergy: No unusual infections.  Allergy .   REST of 12 system review negative except as per HPI   Past Medical History:  Diagnosis Date  . Allergic rhinitis   . Anxiety   . Viral thyroiditis 09/09/2013    Past Surgical History:  Procedure Laterality Date  . CYST EXCISION Right 05/30/2018   Procedure: CYST  REMOVAL RIGHT WRIST;  Surgeon: [name], MD;  Location: [facility];  Service: Orthopedics;  Laterality: Right;    Family History  Problem Relation Age of Onset  . Thyroid disease Mother        removed due to goiter and trouble swallowing  . Hypertension Mother   . Thyroid disease Maternal Grandmother   . Diabetes Paternal Grandmother   . Thyroid disease Maternal Aunt        removed- enlarged    Social History   Socioeconomic History  . Marital status: Single    Spouse name: Not on file  . Number of children: Not on file  . Years of education: Not on file  . Highest education level: Not on file  Occupational History  . Not on file  Tobacco Use  . Smoking status: Never Smoker  . Smokeless tobacco: Never Used  Vaping Use  . Vaping Use: Never used  Substance and Sexual Activity  . Alcohol use: No  . Drug use: No  . Sexual activity: Yes    Birth control/protection: Pill  Other Topics Concern  . Not on file  Social History Narrative   Intact family   HH of 4  Lives with parents and 1 sister        grimsley  A and bs  At [employer] in public  health --    Spanish immersion in Biochemist, clinical and softball    Works hh   Back at home to graduate in dec 21      Social Determinants of Health   Financial Resource Strain: Not on file  Food Insecurity: Not on file  Transportation Needs: Not on file  Physical Activity: Not on file  Stress: Not on file  Social Connections: Not on file    Outpatient Medications Prior to Visit  Medication Sig Dispense Refill  . LARISSIA 0.1-20 MG-MCG tablet TAKE 1 TABLET BY MOUTH EVERY DAY 84 tablet 0  . ibuprofen (ADVIL) 200 MG tablet Take by mouth.     No facility-administered medications prior to visit.     EXAM:  BP 110/70 (BP Location: Left Arm, Patient Position: Sitting, Cuff Size: Large)   Pulse 71   Temp 98.8 F (37.1 C) (Oral)   Ht 5\' 3"  (1.6 m)   Wt 172 lb 3.2 oz (78.1 kg)   LMP 06/03/2020   SpO2  99%   BMI 30.50 kg/m   Body mass index is 30.5 kg/m. Wt Readings from Last 3 Encounters:  06/14/20 172 lb 3.2 oz (78.1 kg)  10/28/19 159 lb 3.2 oz (72.2 kg)  02/12/19 161 lb 6.4 oz (73.2 kg)    Physical Exam: Vital signs reviewed 02/14/19 is a well-developed well-nourished alert cooperative    who appearsr stated age in no acute distress.  HEENT: normocephalic atraumatic , Eyes: PERRL EOM's full, conjunctiva clear, Nares: paten,t no deformity discharge or tenderness., Ears: no deformity EAC's clear TMs with normal landmarks. Mouth:masked NECK: supple without masses, but thyroid palpable CHEST/PULM:  Clear to auscultation and percussion breath sounds equal no wheeze , rales or rhonchi. No chest wall deformities or tenderness. Breast: normal by inspection . No dimpling, discharge, masses, tenderness or discharge . CV: PMI is nondisplaced, S1 S2 no gallops, murmurs, rubs. Peripheral pulses are full without delay.No JVD .  ABDOMEN: Bowel sounds normal nontender  No guard or rebound, no hepato splenomegal no CVA tenderness.  No hernia. Extremtities:  No clubbing cyanosis or edema, no acute joint swelling or redness no focal atrophy NEURO:  Oriented x3, cranial nerves 3-12 appear to be intact, no obvious focal weakness,gait within normal limits no abnormal reflexes or asymmetrical SKIN: No acute rashes normal turgor, color, no bruising or petechiae. PSYCH: Oriented, good eye contact, no obvious depression anxiety, cognition and judgment appear normal. LN: no cervical axillary inguinal adenopathy Pelvic: NL ext GU, labia clear without lesions or rash . Vagina no lesions .Cervix: clear 1+ ectopy   UTERUS: Neg CMT Adnexa:  clear no masses . PAP done  Screen  gc chl tr   Lab Results  Component Value Date   WBC 3.3 (L) 10/28/2019   HGB 13.4 10/28/2019   HCT 39.8 10/28/2019   PLT 358 10/28/2019   GLUCOSE 77 10/28/2019   CHOL 131 10/28/2019   TRIG 51 10/28/2019   HDL 56 10/28/2019   LDLCALC  62 10/28/2019   ALT 24 10/28/2019   AST 23 10/28/2019   NA 138 10/28/2019   K 4.1 10/28/2019   CL 105 10/28/2019   CREATININE 0.95 10/28/2019   BUN 12 10/28/2019   CO2 27 10/28/2019   TSH 0.45 10/28/2019    BP Readings from Last 3 Encounters:  06/14/20 110/70  10/28/19 128/68  02/12/19 120/62     ASSESSMENT AND PLAN:  Discussed the following assessment and plan:    ICD-10-CM  1. Visit for preventive health examination  Z00.00   2. Oral contraceptive use  Z30.41   3. Encounter for gynecological examination without abnormal finding  Z01.419   4. Pap smear for cervical cancer screening  Z12.4 PAP [[place]]    Cervicovaginal ancillary only  exam is normal  Can dec weight for bmi under 30 Plan fu if  Problems  Mood  Etc   continue same  ocps   Low risk sti but screen today   Return in about 1 year (around 06/14/2021) for or as needed , preventive /cpx and medications.  Patient Care Team: Madelin Headings, MD as PCP - General Patient Instructions   Your exam is normal   Will let you know pap results when  available.  Continue lifestyle intervention healthy eating and exercise .    Health Maintenance, Female Adopting a healthy lifestyle and getting preventive care are important in promoting health and wellness. Ask your health care provider about:  The right schedule for you to have regular tests and exams.  Things you can do on your own to prevent diseases and keep yourself healthy. What should I know about diet, weight, and exercise? Eat a healthy diet  Eat a diet that includes plenty of vegetables, fruits, low-fat dairy products, and lean protein.  Do not eat a lot of foods that are high in solid fats, added sugars, or sodium.   Maintain a healthy weight Body mass index (BMI) is used to identify weight problems. It estimates body fat based on height and weight. Your health care provider can help determine your BMI and help you achieve or maintain a healthy  weight. Get regular exercise Get regular exercise. This is one of the most important things you can do for your health. Most adults should:  Exercise for at least 150 minutes each week. The exercise should increase your heart rate and make you sweat (moderate-intensity exercise).  Do strengthening exercises at least twice a week. This is in addition to the moderate-intensity exercise.  Spend less time sitting. Even light physical activity can be beneficial. Watch cholesterol and blood lipids Have your blood tested for lipids and cholesterol at 23 years of age, then have this test every 5 years. Have your cholesterol levels checked more often if:  Your lipid or cholesterol levels are high.  You are older than 23 years of age.  You are at high risk for heart disease. What should I know about cancer screening? Depending on your health history and family history, you may need to have cancer screening at various ages. This may include screening for:  Breast cancer.  Cervical cancer.  Colorectal cancer.  Skin cancer.  Lung cancer. What should I know about heart disease, diabetes, and high blood pressure? Blood pressure and heart disease  High blood pressure causes heart disease and increases the risk of stroke. This is more likely to develop in people who have high blood pressure readings, are of African descent, or are overweight.  Have your blood pressure checked: ? Every 3-5 years if you are 48-54 years of age. ? Every year if you are 64 years old or older. Diabetes Have regular diabetes screenings. This checks your fasting blood sugar level. Have the screening done:  Once every three years after age 31 if you are at a normal weight and have a low risk for diabetes.  More often and at a younger age if you are overweight or have a high risk for diabetes.  What should I know about preventing infection? Hepatitis B If you have a higher risk for hepatitis B, you should be  screened for this virus. Talk with your health care provider to find out if you are at risk for hepatitis B infection. Hepatitis C Testing is recommended for:  Everyone born from [birth year] through 1963.  Anyone with known risk factors for hepatitis C. Sexually transmitted infections (STIs)  Get screened for STIs, including gonorrhea and chlamydia, if: ? You are sexually active and are younger than 23 years of age. ? You are older than 23 years of age and your health care provider tells you that you are at risk for this type of infection. ? Your sexual activity has changed since you were last screened, and you are at increased risk for chlamydia or gonorrhea. Ask your health care provider if you are at risk.  Ask your health care provider about whether you are at high risk for HIV. Your health care provider may recommend a prescription medicine to help prevent HIV infection. If you choose to take medicine to prevent HIV, you should first get tested for HIV. You should then be tested every 3 months for as long as you are taking the medicine. Pregnancy  If you are about to stop having your period (premenopausal) and you may become pregnant, seek counseling before you get pregnant.  Take 400 to 800 micrograms (mcg) of folic acid every day if you become pregnant.  Ask for birth control (contraception) if you want to prevent pregnancy. Osteoporosis and menopause Osteoporosis is a disease in which the bones lose minerals and strength with aging. This can result in bone fractures. If you are 23 years old or older, or if you are at risk for osteoporosis and fractures, ask your health care provider if you should:  Be screened for bone loss.  Take a calcium or vitamin D supplement to lower your risk of fractures.  Be given hormone replacement therapy (HRT) to treat symptoms of menopause. Follow these instructions at home: Lifestyle  Do not use any products that contain nicotine or tobacco, such as  cigarettes, e-cigarettes, and chewing tobacco. If you need help quitting, ask your health care provider.  Do not use street drugs.  Do not share needles.  Ask your health care provider for help if you need support or information about quitting drugs. Alcohol use  Do not drink alcohol if: ? Your health care provider tells you not to drink. ? You are pregnant, may be pregnant, or are planning to become pregnant.  If you drink alcohol: ? Limit how much you use to 0-1 drink a day. ? Limit intake if you are breastfeeding.  Be aware of how much alcohol is in your drink. In the [place] one drink equals one 12 oz bottle of beer (355 mL), one 5 oz glass of wine (148 mL), or one 1 oz glass of hard liquor (44 mL). General instructions  Schedule regular health, dental, and eye exams.  Stay current with your vaccines.  Tell your health care provider if: ? You often feel depressed. ? You have ever been abused or do not feel safe at home. Summary  Adopting a healthy lifestyle and getting preventive care are important in promoting health and wellness.  Follow your health care provider's instructions about healthy diet, exercising, and getting tested or screened for diseases.  Follow your health care provider's instructions on monitoring your cholesterol and blood pressure. This information is not intended to  replace advice given to you by your health care provider. Make sure you discuss any questions you have with your health care provider. Document Revised: 02/27/2018 Document Reviewed: 02/27/2018 Elsevier Patient Education  2021 [employer].    [place]. Jakyia Gaccione M.D.

## 2020-06-14 NOTE — Patient Instructions (Signed)
Your exam is normal   Will let you know pap results when  available.  Continue lifestyle intervention healthy eating and exercise .    Health Maintenance, Female Adopting a healthy lifestyle and getting preventive care are important in promoting health and wellness. Ask your health care provider about:  The right schedule for you to have regular tests and exams.  Things you can do on your own to prevent diseases and keep yourself healthy. What should I know about diet, weight, and exercise? Eat a healthy diet  Eat a diet that includes plenty of vegetables, fruits, low-fat dairy products, and lean protein.  Do not eat a lot of foods that are high in solid fats, added sugars, or sodium.   Maintain a healthy weight Body mass index (BMI) is used to identify weight problems. It estimates body fat based on height and weight. Your health care provider can help determine your BMI and help you achieve or maintain a healthy weight. Get regular exercise Get regular exercise. This is one of the most important things you can do for your health. Most adults should:  Exercise for at least 150 minutes each week. The exercise should increase your heart rate and make you sweat (moderate-intensity exercise).  Do strengthening exercises at least twice a week. This is in addition to the moderate-intensity exercise.  Spend less time sitting. Even light physical activity can be beneficial. Watch cholesterol and blood lipids Have your blood tested for lipids and cholesterol at 23 years of age, then have this test every 5 years. Have your cholesterol levels checked more often if:  Your lipid or cholesterol levels are high.  You are older than 23 years of age.  You are at high risk for heart disease. What should I know about cancer screening? Depending on your health history and family history, you may need to have cancer screening at various ages. This may include screening for:  Breast  cancer.  Cervical cancer.  Colorectal cancer.  Skin cancer.  Lung cancer. What should I know about heart disease, diabetes, and high blood pressure? Blood pressure and heart disease  High blood pressure causes heart disease and increases the risk of stroke. This is more likely to develop in people who have high blood pressure readings, are of African descent, or are overweight.  Have your blood pressure checked: ? Every 3-5 years if you are 10-53 years of age. ? Every year if you are 39 years old or older. Diabetes Have regular diabetes screenings. This checks your fasting blood sugar level. Have the screening done:  Once every three years after age 20 if you are at a normal weight and have a low risk for diabetes.  More often and at a younger age if you are overweight or have a high risk for diabetes. What should I know about preventing infection? Hepatitis B If you have a higher risk for hepatitis B, you should be screened for this virus. Talk with your health care provider to find out if you are at risk for hepatitis B infection. Hepatitis C Testing is recommended for:  Everyone born from [birth year] through 1963.  Anyone with known risk factors for hepatitis C. Sexually transmitted infections (STIs)  Get screened for STIs, including gonorrhea and chlamydia, if: ? You are sexually active and are younger than 23 years of age. ? You are older than 23 years of age and your health care provider tells you that you are at risk for this type of  infection. ? Your sexual activity has changed since you were last screened, and you are at increased risk for chlamydia or gonorrhea. Ask your health care provider if you are at risk.  Ask your health care provider about whether you are at high risk for HIV. Your health care provider may recommend a prescription medicine to help prevent HIV infection. If you choose to take medicine to prevent HIV, you should first get tested for HIV. You should  then be tested every 3 months for as long as you are taking the medicine. Pregnancy  If you are about to stop having your period (premenopausal) and you may become pregnant, seek counseling before you get pregnant.  Take 400 to 800 micrograms (mcg) of folic acid every day if you become pregnant.  Ask for birth control (contraception) if you want to prevent pregnancy. Osteoporosis and menopause Osteoporosis is a disease in which the bones lose minerals and strength with aging. This can result in bone fractures. If you are 4 years old or older, or if you are at risk for osteoporosis and fractures, ask your health care provider if you should:  Be screened for bone loss.  Take a calcium or vitamin D supplement to lower your risk of fractures.  Be given hormone replacement therapy (HRT) to treat symptoms of menopause. Follow these instructions at home: Lifestyle  Do not use any products that contain nicotine or tobacco, such as cigarettes, e-cigarettes, and chewing tobacco. If you need help quitting, ask your health care provider.  Do not use street drugs.  Do not share needles.  Ask your health care provider for help if you need support or information about quitting drugs. Alcohol use  Do not drink alcohol if: ? Your health care provider tells you not to drink. ? You are pregnant, may be pregnant, or are planning to become pregnant.  If you drink alcohol: ? Limit how much you use to 0-1 drink a day. ? Limit intake if you are breastfeeding.  Be aware of how much alcohol is in your drink. In the [place] one drink equals one 12 oz bottle of beer (355 mL), one 5 oz glass of wine (148 mL), or one 1 oz glass of hard liquor (44 mL). General instructions  Schedule regular health, dental, and eye exams.  Stay current with your vaccines.  Tell your health care provider if: ? You often feel depressed. ? You have ever been abused or do not feel safe at home. Summary  Adopting a  healthy lifestyle and getting preventive care are important in promoting health and wellness.  Follow your health care provider's instructions about healthy diet, exercising, and getting tested or screened for diseases.  Follow your health care provider's instructions on monitoring your cholesterol and blood pressure. This information is not intended to replace advice given to you by your health care provider. Make sure you discuss any questions you have with your health care provider. Document Revised: 02/27/2018 Document Reviewed: 02/27/2018 Elsevier Patient Education  2021 [employer].

## 2020-06-15 LAB — CYTOLOGY - PAP: Diagnosis: NEGATIVE

## 2020-06-15 LAB — CERVICOVAGINAL ANCILLARY ONLY
Chlamydia: NEGATIVE
Comment: NEGATIVE
Comment: NEGATIVE
Comment: NORMAL
Neisseria Gonorrhea: NEGATIVE
Trichomonas: POSITIVE — AB

## 2020-06-16 MED ORDER — METRONIDAZOLE 500 MG PO TABS
500.0000 mg | ORAL_TABLET | Freq: Two times a day (BID) | ORAL | 0 refills | Status: DC
Start: 2020-06-16 — End: 2020-06-28

## 2020-06-16 NOTE — Progress Notes (Signed)
Pap Smear is negative  STI screen negative for chlamydia gonorrhea but there is trichomonas. Trichomonas is usually a sexually transmitted but is can remain minimally symptomatic for a long time before checked.  Can cause UTI symptoms /discharge. Treatment should include her and her partner.. Please send in metronidazole 500 mg bid for 7 days   disp 14  Partner should take  2 grams in a single  dose   No IC until both treated    to avoid recurrence  [email or web address]

## 2020-06-16 NOTE — Addendum Note (Signed)
Addended by: Christy Sartorius on: 06/16/2020 10:39 AM   Modules accepted: Orders

## 2020-06-28 ENCOUNTER — Encounter: Payer: Self-pay | Admitting: Internal Medicine

## 2020-06-28 ENCOUNTER — Ambulatory Visit: Payer: BC Managed Care – PPO | Admitting: Internal Medicine

## 2020-06-28 VITALS — BP 100/60 | HR 78 | Temp 98.9°F | Ht 63.0 in | Wt 171.2 lb

## 2020-06-28 DIAGNOSIS — Z113 Encounter for screening for infections with a predominantly sexual mode of transmission: Secondary | ICD-10-CM

## 2020-06-28 DIAGNOSIS — A5901 Trichomonal vulvovaginitis: Secondary | ICD-10-CM | POA: Diagnosis not present

## 2020-06-28 NOTE — Patient Instructions (Signed)
Can get   future lab work  For  HIV rpr screening .if  You want.   Let us know if  Sx return and consider either re treatment or different med .   Consider  [facility]  as a counseling options.

## 2020-06-28 NOTE — Progress Notes (Signed)
Chief Complaint  Patient presents with  . Follow-up    HPI: Brandy Sanchez 25 y.o. come in for fu   rx  Trich seen on screening test   His 95% better  (despite  Not thinking she had a sx at the time )  Vagina discharge   Partner at [facility] was given 2 gram  Flagyl dosing and had no sx  ( they only test of gc chlamydia so no testing done for trichj)   Anxiety up and down   couselor  Support at [facility] not adequate.   1 partner for years  ROS: See pertinent positives and negatives per HPI.  Past Medical History:  Diagnosis Date  . Allergic rhinitis   . Anxiety   . Viral thyroiditis 09/09/2013    Family History  Problem Relation Age of Onset  . Thyroid disease Mother        removed due to goiter and trouble swallowing  . Hypertension Mother   . Thyroid disease Maternal Grandmother   . Diabetes Paternal Grandmother   . Thyroid disease Maternal Aunt        removed- enlarged    Social History   Socioeconomic History  . Marital status: Single    Spouse name: Not on file  . Number of children: Not on file  . Years of education: Not on file  . Highest education level: Not on file  Occupational History  . Not on file  Tobacco Use  . Smoking status: Never Smoker  . Smokeless tobacco: Never Used  Vaping Use  . Vaping Use: Never used  Substance and Sexual Activity  . Alcohol use: No  . Drug use: No  . Sexual activity: Yes    Birth control/protection: Pill  Other Topics Concern  . 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 LARISSIA 0.1-20 MG-MCG tablet TAKE 1 TABLET BY MOUTH EVERY DAY 84 tablet 0  . metroNIDAZOLE (FLAGYL) 500 MG tablet Take 1 tablet (500 mg total) by mouth 2 (two) times daily. 14 tablet 0   No facility-administered medications prior to visit.     EXAM:  BP 100/60 (BP Location: Left Arm, Patient Position: Sitting, Cuff Size: Large)   Pulse 78   Temp 98.9 F (37.2 C) (Oral)   Ht 5\' 3"  (1.6 m)   Wt 171 lb 3.2 oz (77.7 kg)   LMP 06/03/2020 (Exact Date)   SpO2 97%   BMI 30.33 kg/m   Body mass index is 30.33 kg/m.  GENERAL: vitals reviewed and listed above, alert, oriented, appears well hydrated and in no acute distress HEENT: atraumatic, conjunctiva  clear, no obvious abnormalities on inspection of external nose and ears OP : masked  PSYCH: pleasant and cooperative, no obvious depression or anxiety Lab Results  Component Value Date   WBC 3.3 (L) 10/28/2019   HGB 13.4 10/28/2019   HCT 39.8 10/28/2019   PLT 358 10/28/2019   GLUCOSE 77 10/28/2019   CHOL 131 10/28/2019   TRIG 51 10/28/2019   HDL 56 10/28/2019   LDLCALC 62 10/28/2019   ALT 24 10/28/2019   AST 23 10/28/2019   NA 138 10/28/2019   K 4.1 10/28/2019   CL 105 10/28/2019   CREATININE 0.95 10/28/2019   BUN 12 10/28/2019   CO2 27 10/28/2019   TSH 0.45 10/28/2019   BP Readings from Last 3 Encounters:  06/28/20 100/60  06/14/20 110/70  10/28/19 128/68    ASSESSMENT AND PLAN:  Discussed the following assessment and plan:  Trichomonal vaginitis  Routine screening for STI (sexually transmitted infection) - Plan: RPR, HIV Antibody (routine testing w rflx), CBC with Differential/Platelet Suggest test  hiv and rpr if wish  Uncertain how long  she has had this infection  But is better .  Will check on toc info  But not now. Med was given  3 30  22  -Patient advised to return or notify health care team  if  new concerns  arise.  Patient Instructions  Can get   future lab work  For  HIV rpr screening .if  You want.   Let know if  Sx return and consider either re treatment or different med .   Consider  [facility]  as a counseling options.     [place]. Nikko Goldwire M.D.

## 2020-07-19 NOTE — Progress Notes (Signed)
Chief Complaint  Patient presents with  . Vaginal Discharge    HPI: Brandy Sanchez 25 y.o. come in for fu trichomonas vaginitis see last notes.  States that her symptoms improved her partner was treated but recently she has had some increasing discharge and itching.  Wonders if it could be a yeast infection or other. Last menstrual period near the end of March she is on low-dose OCPs no missed pills.  ROS: See pertinent positives and negatives per HPI.  Past Medical History:  Diagnosis Date  . Allergic rhinitis   . Anxiety   . Viral thyroiditis 09/09/2013    Family History  Problem Relation Age of Onset  . Thyroid disease Mother        removed due to goiter and trouble swallowing  . Hypertension Mother   . Thyroid disease Maternal Grandmother   . Diabetes Paternal Grandmother   . Thyroid disease Maternal Aunt        removed- enlarged    Social History   Socioeconomic History  . Marital status: Single    Spouse name: Not on file  . Number of children: Not on file  . Years of education: Not on file  . Highest education level: Not on file  Occupational History  . Not on file  Tobacco Use  . Smoking status: Never Smoker  . Smokeless tobacco: Never Used  Vaping Use  . Vaping Use: Never used  Substance and Sexual Activity  . Alcohol use: No  . Drug use: No  . Sexual activity: Yes    Birth control/protection: Pill  Other Topics Concern  . Not on file  Social History Narrative   Intact family   HH of 4  Lives with parents and 1 sister        grimsley  A and bs  At [employer] in public health --    Spanish immersion in Biochemist, clinical and softball    Works hh   Back at home to graduate in dec 21      Social Determinants of Health   Financial Resource Strain: Not on file  Food Insecurity: Not on file  Transportation Needs: Not on file  Physical Activity: Not on file  Stress: Not on file  Social Connections: Not on file    Outpatient Medications  Prior to Visit  Medication Sig Dispense Refill  . LARISSIA 0.1-20 MG-MCG tablet TAKE 1 TABLET BY MOUTH EVERY DAY 84 tablet 0   No facility-administered medications prior to visit.     EXAM:  BP 110/66 (BP Location: Left Arm, Patient Position: Sitting, Cuff Size: Normal)   Pulse 79   Temp 97.9 F (36.6 C) (Oral)   Ht 5\' 3"  (1.6 m)   Wt 176 lb 3.2 oz (79.9 kg)   LMP 06/03/2020 (Exact Date)   SpO2 99%   BMI 31.21 kg/m   Body mass index is 31.21 kg/m.  GENERAL: vitals reviewed and listed above, alert, oriented, appears well hydrated and in no acute distress HEENT: atraumatic, conjunctiva  clear, no obvious abnormalities on inspection of external nose and ears OP : Masked NECK: no obvious masses on inspection palpation  External GU no lesions a sickly normal no fissures.  Exam small white discharge with mild hyperemia no foamy discharge.  Cervix no acute findings Aptima acted and sent GC chlamydia trichomoniasis yeast and BV. MS: moves all extremities without noticeable focal  abnormality PSYCH: pleasant and cooperative, no obvious depression or anxiety  BP  Readings from Last 3 Encounters:  07/20/20 110/66  06/28/20 100/60  06/14/20 110/70    ASSESSMENT AND PLAN:  Discussed the following assessment and plan:  Vaginal discharge - Plan: Cervicovaginal ancillary only  Subacute vaginitis  Oral contraceptive use Vaginitis recurrent versus other consider Candida yeast.  Discussed options at this time will wait for results or could use over-the-counter in the short run. If positive for trichomonas again consider other treatment but essentially resistant organism Expectant management. OCP use light or skip. -Patient advised to return or notify health care team  if  new concerns arise.  Patient Instructions  Could be yeast  Checking lab .   Can try otc monistat or  We can do  diflucan or wait  For results.    Neta Mends. Mahoganie Basher M.D.

## 2020-07-20 ENCOUNTER — Encounter: Payer: Self-pay | Admitting: Internal Medicine

## 2020-07-20 ENCOUNTER — Ambulatory Visit: Payer: BC Managed Care – PPO | Admitting: Internal Medicine

## 2020-07-20 ENCOUNTER — Other Ambulatory Visit (HOSPITAL_COMMUNITY)
Admission: RE | Admit: 2020-07-20 | Discharge: 2020-07-20 | Disposition: A | Discharge status: Home / Self Care | Payer: BC Managed Care – PPO | Source: Ambulatory Visit | Attending: Internal Medicine | Admitting: Internal Medicine

## 2020-07-20 ENCOUNTER — Other Ambulatory Visit: Payer: Self-pay

## 2020-07-20 VITALS — BP 110/66 | HR 79 | Temp 97.9°F | Ht 63.0 in | Wt 176.2 lb

## 2020-07-20 DIAGNOSIS — Z3041 Encounter for surveillance of contraceptive pills: Secondary | ICD-10-CM | POA: Diagnosis not present

## 2020-07-20 DIAGNOSIS — N898 Other specified noninflammatory disorders of vagina: Secondary | ICD-10-CM

## 2020-07-20 DIAGNOSIS — N761 Subacute and chronic vaginitis: Secondary | ICD-10-CM

## 2020-07-20 NOTE — Patient Instructions (Signed)
Could be yeast  Checking lab .   Can try otc monistat or  We can do  diflucan or wait  For results.

## 2020-07-21 ENCOUNTER — Other Ambulatory Visit: Payer: Self-pay

## 2020-07-21 LAB — CERVICOVAGINAL ANCILLARY ONLY
Bacterial Vaginitis (gardnerella): NEGATIVE
Candida Glabrata: POSITIVE — AB
Candida Vaginitis: POSITIVE — AB
Chlamydia: NEGATIVE
Comment: NEGATIVE
Comment: NEGATIVE
Comment: NEGATIVE
Comment: NEGATIVE
Comment: NEGATIVE
Comment: NORMAL
Neisseria Gonorrhea: NEGATIVE
Trichomonas: NEGATIVE

## 2020-07-21 MED ORDER — FLUCONAZOLE 150 MG PO TABS: 150.0000 mg | ORAL_TABLET | Freq: Every day | ORAL | 0 refills | Status: DC

## 2020-07-21 NOTE — Progress Notes (Signed)
So test came back negative for trichomonas it but positive for yeast as we suspected. You can go ahead and take the over-the-counter Monistat every night for 3 nights we can also send in Diflucan 150 mg dispense 1 to take 1 pill for the yeast infection to your pharmacy.  Follow-up if not improving.  Please send in prescription if she request the Diflucan.

## 2020-07-21 NOTE — Telephone Encounter (Signed)
Please send medicine and as per result note

## 2020-09-23 ENCOUNTER — Other Ambulatory Visit: Payer: Self-pay

## 2020-09-24 MED ORDER — LEVONORGESTREL-ETHINYL ESTRAD 0.1-20 MG-MCG PO TABS: 1.0000 | ORAL_TABLET | Freq: Every day | ORAL | 2 refills | Status: DC

## 2020-12-13 ENCOUNTER — Telehealth (INDEPENDENT_AMBULATORY_CARE_PROVIDER_SITE_OTHER): Payer: BC Managed Care – PPO | Admitting: Internal Medicine

## 2020-12-13 ENCOUNTER — Encounter: Payer: Self-pay | Admitting: Internal Medicine

## 2020-12-13 VITALS — Ht 63.0 in | Wt 176.2 lb

## 2020-12-13 DIAGNOSIS — Z8616 Personal history of COVID-19: Secondary | ICD-10-CM | POA: Diagnosis not present

## 2020-12-13 DIAGNOSIS — R061 Stridor: Secondary | ICD-10-CM | POA: Diagnosis not present

## 2020-12-13 DIAGNOSIS — R0600 Dyspnea, unspecified: Secondary | ICD-10-CM

## 2020-12-13 NOTE — Progress Notes (Signed)
Virtual Visit via Video Note  I connected   w Brandy Sanchez on 12/13/20 at  1:30 PM EDT by a video enabled telemedicine application and verified that I am speaking with the correct person using two identifiers. Location patient: home Location provider:work  office Persons participating in the virtual visit: patient, provider  [place] recommendations  regarding COVID 19 pandemic   video visit is advised over in office visit for this patient.  Patient aware  of the limitations of evaluation and management by telemedicine and  availability of in person appointments. and agreed to proceed.   HPI: Brandy Sanchez presents for video visit because of 2 months history of what is called wheezing. She developed COVID respiratory infection July 24 with severe coughing for about 2 weeks fever for 5 days had been immunized x3.  Since that time she feels her exercise tolerance is down has what she calls wheezing and her mother would hear sounds.  She describes it as sounds with inhalation and out when she takes a deep breath.  Sometimes has a hard time when she goes upstairs with this. No further coughing no past history of asthma inhaler use.  No symptoms at rest seemingly.  Voice is okay.  ROS: See pertinent positives and negatives per HPI.  Past Medical History:  Diagnosis Date   Allergic rhinitis    Anxiety    Viral thyroiditis 09/09/2013    Past Surgical History:  Procedure Laterality Date   CYST EXCISION Right 05/30/2018   Procedure: CYST REMOVAL RIGHT WRIST;  Surgeon: [name], MD;  Location: [facility];  Service: Orthopedics;  Laterality: Right;    Family History  Problem Relation Age of Onset   Thyroid disease Mother        removed due to goiter and trouble swallowing   Hypertension Mother    Thyroid disease Maternal Grandmother    Diabetes Paternal Grandmother    Thyroid disease Maternal Aunt        removed- enlarged    Social History   Tobacco Use    Smoking status: Never   Smokeless tobacco: Never  Vaping Use   Vaping Use: Never used  Substance Use Topics   Alcohol use: No   Drug use: No      Current Outpatient Medications:    levonorgestrel-ethinyl estradiol (LARISSIA) 0.1-20 MG-MCG tablet, Take 1 tablet by mouth daily., Disp: 84 tablet, Rfl: 2  EXAM: BP Readings from Last 3 Encounters:  07/20/20 110/66  06/28/20 100/60  06/14/20 110/70    VITALS per patient if applicable:  GENERAL: alert, oriented, appears well and in no acute distress looks well no respiratory distress at rest voice appears to be clear and not hoarse.  HEENT: atraumatic, conjunttiva clear, no obvious abnormalities on inspection of external nose and ears  NECK: normal movements of the head and neck  LUNGS: on inspection no signs of respiratory distress, breathing rate appears normal, no obvious gross SOB, gasping or wheezing  CV: no obvious cyanosis  MS: moves all visible extremities without noticeable abnormality  PSYCH/NEURO: pleasant and cooperative, no obvious depression or anxiety, speech and thought processing grossly intact Lab Results  Component Value Date   WBC 3.3 (L) 10/28/2019   HGB 13.4 10/28/2019   HCT 39.8 10/28/2019   PLT 358 10/28/2019   GLUCOSE 77 10/28/2019   CHOL 131 10/28/2019   TRIG 51 10/28/2019   HDL 56 10/28/2019   LDLCALC 62 10/28/2019   ALT 24 10/28/2019  AST 23 10/28/2019   NA 138 10/28/2019   K 4.1 10/28/2019   CL 105 10/28/2019   CREATININE 0.95 10/28/2019   BUN 12 10/28/2019   CO2 27 10/28/2019   TSH 0.45 10/28/2019    ASSESSMENT AND PLAN:  Discussed the following assessment and plan:    ICD-10-CM   1. Stridor w exertion  R06.1 DG Chest 2 View    Ambulatory referral to ENT   after covid infection    2. Personal history of COVID-19  Z86.16 DG Chest 2 View    Ambulatory referral to ENT    3. Dyspnea, unspecified type  R06.00 DG Chest 2 View    Ambulatory referral to ENT     Symptoms  sound like vocal cord dysfunction post significant coughing illness with COVID however cannot exclude lower airway damage and wheezing.  Or other causes Get chest x-ray we will send for ENT referral because of stridorous symptoms May also require pulmonary assessment with PFTs spirometry and consult. Counseled.   Expectant management and discussion of plan and treatment with opportunity to ask questions and all were answered. The patient agreed with the plan and demonstrated an understanding of the instructions.   Advised to call back or seek an in-person evaluation if worsening  or having  further concerns . Return for depending on results ent and  x ray eval.    Berniece Andreas, MD

## 2020-12-14 ENCOUNTER — Ambulatory Visit (INDEPENDENT_AMBULATORY_CARE_PROVIDER_SITE_OTHER)
Admission: RE | Admit: 2020-12-14 | Discharge: 2020-12-14 | Disposition: A | Discharge status: Home / Self Care | Payer: BC Managed Care – PPO | Source: Ambulatory Visit | Attending: Internal Medicine | Admitting: Internal Medicine

## 2020-12-14 ENCOUNTER — Other Ambulatory Visit: Payer: Self-pay

## 2020-12-14 DIAGNOSIS — R0600 Dyspnea, unspecified: Secondary | ICD-10-CM

## 2020-12-14 DIAGNOSIS — Z8616 Personal history of COVID-19: Secondary | ICD-10-CM | POA: Diagnosis not present

## 2020-12-14 DIAGNOSIS — R061 Stridor: Secondary | ICD-10-CM | POA: Diagnosis not present

## 2020-12-14 NOTE — Progress Notes (Signed)
X ray shows no abnormality. Proceed with ent evaluation  . Consider pulmonary evaluation  with  spirometry if  ongoing

## 2021-02-15 ENCOUNTER — Encounter: Payer: Self-pay | Admitting: Internal Medicine

## 2021-02-15 ENCOUNTER — Ambulatory Visit (INDEPENDENT_AMBULATORY_CARE_PROVIDER_SITE_OTHER): Payer: BC Managed Care – PPO | Admitting: Internal Medicine

## 2021-02-15 VITALS — BP 126/70 | HR 68 | Temp 98.4°F | Ht 63.0 in | Wt 173.4 lb

## 2021-02-15 DIAGNOSIS — R399 Unspecified symptoms and signs involving the genitourinary system: Secondary | ICD-10-CM

## 2021-02-15 DIAGNOSIS — R0609 Other forms of dyspnea: Secondary | ICD-10-CM | POA: Insufficient documentation

## 2021-02-15 DIAGNOSIS — R5383 Other fatigue: Secondary | ICD-10-CM | POA: Diagnosis not present

## 2021-02-15 DIAGNOSIS — E040 Nontoxic diffuse goiter: Secondary | ICD-10-CM

## 2021-02-15 LAB — POC URINALSYSI DIPSTICK (AUTOMATED)
Bilirubin, UA: NEGATIVE
Blood, UA: NEGATIVE
Glucose, UA: NEGATIVE
Ketones, UA: NEGATIVE
Leukocytes, UA: NEGATIVE
Nitrite, UA: NEGATIVE
Protein, UA: NEGATIVE
Spec Grav, UA: 1.015 (ref 1.010–1.025)
Urobilinogen, UA: 0.2 E.U./dL
pH, UA: 6 (ref 5.0–8.0)

## 2021-02-15 NOTE — Progress Notes (Signed)
Chief Complaint  Patient presents with   Dysuria    Ptient complains of dysuria, x2 weeks     HPI: Brandy Sanchez 25 y.o. come in for  a few issues   Ent  saw  her and dx some swelling vocal chords but no lesions and referred to pulmonary   2 weeks ago  had  post void pressure  . Felt  like uti and using home remedies and went away . But now still  some sx left. Although better  LMP:    nov 23   5 days .  Nl .     Frequency .   No blood   not sure if has a uti getting better  would like check just in case  no vag  sx monogamous .  No fever abd pain  some pressure   Wants to be checked for anemia iron fatigue  ROS: See pertinent positives and negatives per HPI.  Past Medical History:  Diagnosis Date   Allergic rhinitis    Anxiety    Viral thyroiditis 09/09/2013    Family History  Problem Relation Age of Onset   Thyroid disease Mother        removed due to goiter and trouble swallowing   Hypertension Mother    Thyroid disease Maternal Grandmother    Diabetes Paternal Grandmother    Thyroid disease Maternal Aunt        removed- enlarged    Social History   Socioeconomic History   Marital status: Single    Spouse name: Not on file   Number of children: Not on file   Years of education: Not on file   Highest education level: Not on file  Occupational History   Not on file  Tobacco Use   Smoking status: Never   Smokeless tobacco: Never  Vaping Use   Vaping Use: Never used  Substance and Sexual Activity   Alcohol use: No   Drug use: No   Sexual activity: Yes    Birth control/protection: Pill  Other Topics Concern   Not on file  Social History Narrative   Intact family   HH of 4  Lives with parents and 1 sister        [employer]  A and bs  At [employer] in public health --    Spanish immersion in Biochemist, clinical and softball    Works hh   Back at home to graduate in dec 21      Social Determinants of Health   Financial Resource Strain: Not on  file  Food Insecurity: Not on file  Transportation Needs: Not on file  Physical Activity: Not on file  Stress: Not on file  Social Connections: Not on file    Outpatient Medications Prior to Visit  Medication Sig Dispense Refill   levonorgestrel-ethinyl estradiol (LARISSIA) 0.1-20 MG-MCG tablet Take 1 tablet by mouth daily. 84 tablet 2   No facility-administered medications prior to visit.     EXAM:  BP 126/70 (BP Location: Left Arm, Patient Position: Sitting, Cuff Size: Normal)   Pulse 68   Temp 98.4 F (36.9 C) (Oral)   Ht 5\' 3"  (1.6 m)   Wt 173 lb 6.4 oz (78.7 kg)   SpO2 99%   BMI 30.72 kg/m   Body mass index is 30.72 kg/m.  GENERAL: vitals reviewed and listed above, alert, oriented, appears well hydrated and in no acute distress HEENT: atraumatic, conjunctiva  clear, no obvious  abnormalities on inspection of external nose and ears OP : masked  NECK: no adenopathy  thyroid palpable  LCV: HRRR, no clubbing cyanosis or  peripheral edema nl cap refill  Abdomen:  Sof,t normal bowel sounds without hepatosplenomegaly, no guarding rebound or masses no CVA tenderness MS: moves all extremities without noticeable focal  abnormality PSYCH: pleasant and cooperative, no obvious depression or anxiety Lab Results  Component Value Date   WBC 3.3 (L) 10/28/2019   HGB 13.4 10/28/2019   HCT 39.8 10/28/2019   PLT 358 10/28/2019   GLUCOSE 77 10/28/2019   CHOL 131 10/28/2019   TRIG 51 10/28/2019   HDL 56 10/28/2019   LDLCALC 62 10/28/2019   ALT 24 10/28/2019   AST 23 10/28/2019   NA 138 10/28/2019   K 4.1 10/28/2019   CL 105 10/28/2019   CREATININE 0.95 10/28/2019   BUN 12 10/28/2019   CO2 27 10/28/2019   TSH 0.45 10/28/2019   BP Readings from Last 3 Encounters:  02/15/21 126/70  07/20/20 110/66  06/28/20 100/60   UA is clear  ASSESSMENT AND PLAN:  Discussed the following assessment and plan:  Urinary symptom or sign - Plan: POCT Urinalysis Dipstick (Automated),  Urine Culture, Urine Culture, CBC with Differential/Platelet, Basic metabolic panel, TSH, T4, free, T4, free, TSH, Basic metabolic panel, CBC with Differential/Platelet, CANCELED: Urine Culture  Feeling tired - Plan: CBC with Differential/Platelet, Basic metabolic panel, TSH, T4, free, T4, free, TSH, Basic metabolic panel, CBC with Differential/Platelet  Goiter diffuse - Plan: CBC with Differential/Platelet, Basic metabolic panel, TSH, T4, free, T4, free, TSH, Basic metabolic panel, CBC with Differential/Platelet Proceed with pulmonary evaluation as planned Check urine culture in case low count UTI No alarm features Check thyroid function blood count chemistry today. -Patient advised to return or notify health care team  if  new concerns arise.  Patient Instructions  No obvious infection today   Avoid bladder irritants.  Will let  you know if  any bacteria grow at urine culture . But doubt   UTI    Lab today checking anemia and thyroid and blood sugar.    Neta Mends. Brandy Sanchez M.D.    Kathie Rhodes

## 2021-02-15 NOTE — Patient Instructions (Addendum)
No obvious infection today   Avoid bladder irritants.  Will let  you know if  any bacteria grow at urine culture . But doubt   UTI    Lab today checking anemia and thyroid and blood sugar.

## 2021-02-16 LAB — URINE CULTURE
MICRO NUMBER:: 12690070
Result:: NO GROWTH
SPECIMEN QUALITY:: ADEQUATE

## 2021-02-16 LAB — CBC WITH DIFFERENTIAL/PLATELET
Basophils Absolute: 0.1 10*3/uL (ref 0.0–0.1)
Basophils Relative: 1.2 % (ref 0.0–3.0)
Eosinophils Absolute: 0.5 10*3/uL (ref 0.0–0.7)
Eosinophils Relative: 7.7 % — ABNORMAL HIGH (ref 0.0–5.0)
HCT: 37.3 % (ref 36.0–46.0)
Hemoglobin: 12.6 g/dL (ref 12.0–15.0)
Lymphocytes Relative: 49.8 % — ABNORMAL HIGH (ref 12.0–46.0)
Lymphs Abs: 3 10*3/uL (ref 0.7–4.0)
MCHC: 33.9 g/dL (ref 30.0–36.0)
MCV: 88.1 fl (ref 78.0–100.0)
Monocytes Absolute: 0.5 10*3/uL (ref 0.1–1.0)
Monocytes Relative: 7.8 % (ref 3.0–12.0)
Neutro Abs: 2 10*3/uL (ref 1.4–7.7)
Neutrophils Relative %: 33.5 % — ABNORMAL LOW (ref 43.0–77.0)
Platelets: 417 10*3/uL — ABNORMAL HIGH (ref 150.0–400.0)
RBC: 4.23 Mil/uL (ref 3.87–5.11)
RDW: 12.1 % (ref 11.5–15.5)
WBC: 5.9 10*3/uL (ref 4.0–10.5)

## 2021-02-16 LAB — BASIC METABOLIC PANEL
BUN: 8 mg/dL (ref 6–23)
CO2: 26 mEq/L (ref 19–32)
Calcium: 9.2 mg/dL (ref 8.4–10.5)
Chloride: 104 mEq/L (ref 96–112)
Creatinine, Ser: 0.94 mg/dL (ref 0.40–1.20)
GFR: 85.36 mL/min (ref >60.00)
Glucose, Bld: 74 mg/dL (ref 70–99)
Potassium: 3.9 mEq/L (ref 3.5–5.1)
Sodium: 136 mEq/L (ref 135–145)

## 2021-02-16 LAB — T4, FREE: Free T4: 0.84 ng/dL (ref 0.60–1.60)

## 2021-02-16 LAB — TSH: TSH: 1.45 u[IU]/mL (ref 0.35–5.50)

## 2021-02-17 NOTE — Progress Notes (Signed)
Urine culture shows no infection Rest of labs are normal range except CBC has some minor abnormalities that do not appear to be clinically significant.  There is a slight increase in cells that go up with allergy eosinophils but nothing to be concerned about . I would get yearly blood counts to make sure everything okay.  Stable.

## 2021-02-22 ENCOUNTER — Other Ambulatory Visit: Payer: Self-pay

## 2021-02-22 ENCOUNTER — Other Ambulatory Visit: Payer: Self-pay | Admitting: Internal Medicine

## 2021-02-22 ENCOUNTER — Encounter: Payer: Self-pay | Admitting: Internal Medicine

## 2021-02-22 ENCOUNTER — Ambulatory Visit (INDEPENDENT_AMBULATORY_CARE_PROVIDER_SITE_OTHER): Payer: BC Managed Care – PPO | Admitting: Internal Medicine

## 2021-02-22 VITALS — BP 126/78 | HR 72 | Temp 98.5°F | Ht 64.0 in | Wt 175.4 lb

## 2021-02-22 DIAGNOSIS — D7219 Other eosinophilia: Secondary | ICD-10-CM | POA: Diagnosis not present

## 2021-02-22 DIAGNOSIS — J4530 Mild persistent asthma, uncomplicated: Secondary | ICD-10-CM

## 2021-02-22 MED ORDER — FLUTICASONE-SALMETEROL 100-50 MCG/ACT IN AEPB
1.0000 | INHALATION_SPRAY | Freq: Two times a day (BID) | RESPIRATORY_TRACT | 3 refills | Status: DC
Start: 2021-02-22 — End: 2021-04-26

## 2021-02-22 MED ORDER — ALBUTEROL SULFATE HFA 108 (90 BASE) MCG/ACT IN AERS: 2.0000 | INHALATION_SPRAY | Freq: Four times a day (QID) | RESPIRATORY_TRACT | 5 refills | Status: DC | PRN

## 2021-02-22 MED ORDER — FLUTICASONE PROPIONATE HFA 110 MCG/ACT IN AERO: 1.0000 | INHALATION_SPRAY | Freq: Two times a day (BID) | RESPIRATORY_TRACT | 12 refills | Status: DC

## 2021-02-22 NOTE — Progress Notes (Signed)
The patient has been prescribed the inhaler albuterol, flovent. Inhaler technique was demonstrated to patient. The patient subsequently demonstrated correct technique. ° °

## 2021-02-22 NOTE — Patient Instructions (Signed)
Please schedule follow up scheduled with myself in 2 months.  If my schedule is not open yet, we will contact you with a reminder closer to that time. Please call [phone] if you haven't heard from [place] a month before.   Start flovent 1 puff twice a day. Gargle after use.   Take the albuterol rescue inhaler every 4 to 6 hours as needed for wheezing or shortness of breath. You can also take it 15 minutes before exercise or exertional activity. Side effects include heart racing or pounding, jitters or anxiety. If you have a history of an irregular heart rhythm, it can make this worse. Can also give some patients a hard time sleeping.  To inhale the aerosol using an inhaler, follow these steps:  Remove the protective dust cap from the end of the mouthpiece. If the dust cap was not placed on the mouthpiece, check the mouthpiece for dirt or other objects. Be sure that the canister is fully and firmly inserted in the mouthpiece. 2. If you are using the inhaler for the first time or if you have not used the inhaler in more than 14 days, you will need to prime it. You may also need to prime the inhaler if it has been dropped. Ask your pharmacist or check the manufacturer's information if this happens. To prime the inhaler, shake it well and then press down on the canister 4 times to release 4 sprays into the air, away from your face. Be careful not to get albuterol in your eyes. 3. Shake the inhaler well. 4. Breathe out as completely as possible through your mouth. 4. Hold the canister with the mouthpiece on the bottom, facing you and the canister pointing upward. Place the open end of the mouthpiece into your mouth. Close your lips tightly around the mouthpiece. 6. Breathe in slowly and deeply through the mouthpiece.At the same time, press down once on the container to spray the medication into your mouth. 7. Try to hold your breath for 10 seconds. remove the inhaler, and breathe out slowly. 8. If you  were told to use 2 puffs, wait 1 minute and then repeat steps 3-7. 9. Replace the protective cap on the inhaler. 10. Clean your inhaler regularly. Follow the manufacturer's directions carefully and ask your doctor or pharmacist if you have any questions about cleaning your inhaler.  Check the back of the inhaler to keep track of the total number of doses left on the inhaler.     By learning about asthma and how it can be controlled, you take an important step toward managing this disease. Work closely with your asthma care team to learn all you can about your asthma, how to avoid triggers, what your medications do, and how to take them correctly. With proper care, you can live free of asthma symptoms and maintain a normal, healthy lifestyle.   What is asthma? Asthma is a chronic disease that affects the airways of the lungs. During normal breathing, the bands of muscle that surround the airways are relaxed and air moves freely. During an asthma episode or "attack," there are three main changes that stop air from moving easily through the airways: The bands of muscle that surround the airways tighten and make the airways narrow. This tightening is called bronchospasm.  The lining of the airways becomes swollen or inflamed.  The cells that line the airways produce more mucus, which is thicker than normal and clogs the airways.  These three factors -  bronchospasm, inflammation, and mucus production - cause symptoms such as difficulty breathing, wheezing, and coughing.  What are the most common symptoms of asthma? Asthma symptoms are not the same for everyone. They can even change from episode to episode in the same person. Also, you may have only one symptom of asthma, such as cough, but another person may have all the symptoms of asthma. It is important to know all the symptoms of asthma and to be aware that your asthma can present in any of these ways at any time. The most common symptoms  include: Coughing, especially at night  Shortness of breath  Wheezing  Chest tightness, pain, or pressure   Who is affected by asthma? Asthma affects 22 million Americans; about 6 million of these are children under age 65. People who have a family history of asthma have an increased risk of developing the disease. Asthma is also more common in people who have allergies or who are exposed to tobacco smoke. However, anyone can develop asthma at any time. Some people may have asthma all of their lives, while others may develop it as adults.  What causes asthma? The airways in a person with asthma are very sensitive and react to many things, or "triggers." Contact with these triggers causes asthma symptoms. One of the most important parts of asthma control is to identify your triggers and then avoid them when possible. The only trigger you do not want to avoid is exercise. Pre-treatment with medicines before exercise can allow you to stay active yet avoid asthma symptoms. Common asthma triggers include: Infections (colds, viruses, flu, sinus infections)  Exercise  Weather (changes in temperature and/or humidity, cold air)  Tobacco smoke  Allergens (dust mites, pollens, pets, mold spores, cockroaches, and sometimes foods)  Irritants (strong odors from cleaning products, perfume, wood smoke, air pollution)  Strong emotions such as crying or laughing hard  Some medications   How is asthma diagnosed? To diagnose asthma, your doctor will first review your medical history, family history, and symptoms. Your doctor will want to know any past history of breathing problems you may have had, as well as a family history of asthma, allergies, eczema (a bumpy, itchy skin rash caused by allergies), or other lung disease. It is important that you describe your symptoms in detail (cough, wheeze, shortness of breath, chest tightness), including when and how often they occur. The doctor will perform a physical  examination and listen to your heart and lungs. He or she may also order breathing tests, allergy tests, blood tests, and chest and sinus X-rays. The tests will find out if you do have asthma and if there are any other conditions that are contributing factors.  How is asthma treated? Asthma can be controlled, but not cured. It is not normal to have frequent symptoms, trouble sleeping, or trouble completing tasks. Appropriate asthma care will prevent symptoms and visits to the emergency room and hospital. Asthma medicines are one of the mainstays of asthma treatment. The drugs used to treat asthma are explained below.  Anti-inflammatories: These are the most important drugs for most people with asthma. Anti-inflammatory drugs reduce swelling and mucus production in the airways. As a result, airways are less sensitive and less likely to react to triggers. These medications need to be taken daily and may need to be taken for several weeks before they begin to control asthma. Anti-inflammatory medicines lead to fewer symptoms, better airflow, less sensitive airways, less airway damage, and fewer asthma attacks.  If taken every day, they CONTROL or prevent asthma symptoms.   Bronchodilators: These drugs relax the muscle bands that tighten around the airways. This action opens the airways, letting more air in and out of the lungs and improving breathing. Bronchodilators also help clear mucus from the lungs. As the airways open, the mucus moves more freely and can be coughed out more easily. In short-acting forms, bronchodilators RELIEVE or stop asthma symptoms by quickly opening the airways and are very helpful during an asthma episode. In long-acting forms, bronchodilators provide CONTROL of asthma symptoms and prevent asthma episodes.  Asthma drugs can be taken in a variety of ways. Inhaling the medications by using a metered dose inhaler, dry powder inhaler, or nebulizer is one way of taking asthma medicines.  Oral medicines (pills or liquids you swallow) may also be prescribed.  Asthma severity Asthma is classified as either "intermittent" (comes and goes) or "persistent" (lasting). Persistent asthma is further described as being mild, moderate, or severe. The severity of asthma is based on how often you have symptoms both during the day and night, as well as by the results of lung function tests and by how well you can perform activities. The "severity" of asthma refers to how "intense" or "strong" your asthma is.  Asthma control Asthma control is the goal of asthma treatment. Regardless of your asthma severity, it may or may not be controlled. Asthma control means: You are able to do everything you want to do at work and home  You have no (or minimal) asthma symptoms  You do not wake up from your sleep or earlier than usual in the morning due to asthma  You rarely need to use your reliever medicine (inhaler)  Another major part of your treatment is that you are happy with your asthma care and believe your asthma is controlled.  Monitoring symptoms A key part of treatment is keeping track of how well your lungs are working. Monitoring your symptoms  what they are, how and when they happen, and how severe they are  is an important part of being able to control your asthma.  Sometimes asthma is monitored using a peak flow meter. A peak flow (PF) meter measures how fast the air comes out of your lungs. It can help you know when your asthma is getting worse, sometimes even before you have symptoms. By taking daily peak flow readings, you can learn when to adjust medications to keep asthma under good control. It is also used to create your asthma action plan (see below). Your doctor can use your peak flow readings to adjust your treatment plan in some cases.  Asthma Action Plan Based on your history and asthma severity, you and your doctor will develop a care plan called an "asthma action plan." The  asthma action plan describes when and how to use your medicines, actions to take when asthma worsens, and when to seek emergency care. Make sure you understand this plan. If you do not, ask your asthma care provider any questions you may have. Your asthma action plan is one of the keys to controlling asthma. Keep it readily available to remind you of what you need to do every day to control asthma and what you need to do when symptoms occur.  Goals of asthma therapy These are the goals of asthma treatment: Live an active, normal life  Prevent chronic and troublesome symptoms  Attend work or school every day  Perform daily activities without difficulty  Stop urgent visits to the doctor, emergency department, or hospital  Use and adjust medications to control asthma with few or no side effects

## 2021-02-22 NOTE — Progress Notes (Signed)
Brandy Sanchez    [id number]    [birth year]  Primary Care Physician:Panosh, Standley Brooking, MD  Referring Physician: Jolene Provost, PA-C [address] [place],  [place] [address] Reason for Consultation: wheezing and coughing Date of Consultation: 02/22/2021  Chief complaint:   Chief Complaint  Patient presents with   Consult    Covid in July, Wheezing and productive cough     HPI: Brandy Sanchez is a 25 y.o. who presents with coughing and wheezing. She had persistent cough with covid infection in July 2022. Case was mild, managed expectantly.Coughing went away mostly but then she developed wheezing as well. Wheezing is going up stairs and accompanied with dyspnea. She has not been able to exercise due to this.   She does have seasonal allergies and takes claritin and zyrtec she takes this as needed worse in the summer fall.   She has no prior history of asthma. Sister does have asthma.   She has not tried any inhalers or any medications.  She cannot identify any triggers which make her symptoms worse beyond exertion.   Social history:  Occupation: She works for her [employer] business. 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  Exposures: lives at home with parents and sister.  Smoking history: never smoker, no passive smoke exposure  Social History   Occupational History   Not on file  Tobacco Use   Smoking status: Never   Smokeless tobacco: Never  Vaping Use   Vaping Use: Never used  Substance and Sexual Activity   Alcohol use: No   Drug use: No   Sexual activity: Yes    Birth control/protection: Pill    Relevant family history:  Family History  Problem Relation Age of Onset   Thyroid disease Mother        removed due to goiter and trouble swallowing   Hypertension Mother    Asthma Sister    Thyroid disease Maternal Grandmother    Diabetes Paternal Grandmother    Thyroid disease Maternal Aunt        removed- enlarged    Past  Medical History:  Diagnosis Date   Allergic rhinitis    Anxiety    Viral thyroiditis 09/09/2013    Past Surgical History:  Procedure Laterality Date   CYST EXCISION Right 05/30/2018   Procedure: CYST REMOVAL RIGHT WRIST;  Surgeon: Daryll Brod, MD;  Location: [facility];  Service: Orthopedics;  Laterality: Right;     Physical Exam: Blood pressure 126/78, pulse 72, temperature 98.5 F (36.9 C), temperature source Oral, height '5\' 4"'  (1.626 m), weight 175 lb 6.4 oz (79.6 kg), SpO2 99 %. Gen:      No acute distress ENT:  no nasal polyps, mucus membranes moist Lungs:    No increased respiratory effort, symmetric chest wall excursion, clear to auscultation bilaterally, no wheezes or crackles CV:         Regular rate and rhythm; no murmurs, rubs, or gallops.  No pedal edema Abd:      + bowel sounds; soft, non-tender; no distension MSK: no acute synovitis of DIP or PIP joints, no mechanics hands.  Skin:      Warm and dry; no rashes Neuro: normal speech, no focal facial asymmetry Psych: alert and oriented x3, normal mood and affect   Data Reviewed/Medical Decision Making:  Independent interpretation of tests: Imaging:  PFTs: Never had No flowsheet data found.  Labs:  Lab Results  Component Value Date   WBC 5.9 02/15/2021   HGB 12.6 02/15/2021   HCT 37.3 02/15/2021   MCV 88.1 02/15/2021   PLT 417.0 (H) 02/15/2021   Eosinophils elevated 7.7%  Immunization status:  Immunization History  Administered Date(s) Administered   DTP 07/16/1997, 09/03/1997, 11/11/1997, 08/02/1998, 08/26/2002   HPV 9-valent 07/23/2015   HPV Quadrivalent 08/12/2010, 10/13/2010   Hepatitis A, Adult 07/23/2015   Hepatitis B 1995/07/30, 09/03/1997, 11/11/1997   HiB (PRP-OMP) 07/16/1997, 09/03/1997, 11/11/1997, 05/03/1998   Influenza Inj Mdck Quad Pf 04/09/2020, 02/06/2021   Influenza Split 01/18/2011, 01/15/2012   Influenza Whole 01/29/2009, 02/09/2010   Influenza,inj,Quad PF,6+ Mos  01/21/2013, 12/29/2014, 12/04/2016   MMR 05/03/1998, 08/26/2002   Meningococcal Conjugate 08/12/2010   Meningococcal Polysaccharide 08/26/2008   Moderna Sars-Covid-2 Vaccination 05/24/2019, 06/21/2019, 04/09/2020   OPV 07/16/1997, 09/03/1997, 05/03/1998, 08/26/2002   PPD Test 08/25/2013, 08/09/2016   Pfizer Covid-19 Vaccine Bivalent Booster 47yr & up 02/06/2021   Pneumococcal Conjugate-13 12/27/1998, 02/08/1999   Td 08/26/2008   Tdap 10/02/2018   Varicella 05/03/1998, 08/26/2008     I reviewed prior external note(s) from ENT, PCP  I reviewed the result(s) of the labs and imaging as noted above.   I have ordered spiro and feno to be obtained at next visit.    Assessment:  mild persistent asthma Peripheral eosinophilia  Plan/Recommendations:  Symptoms very suspicious for  new diagnosis asthma, possibly activated from recent covid infection. Start flovent 110 1 puff twice a day. Start prn albuterol.  Will obtain spirometry/feno at next visit.   We discussed disease management and progression at length today regarding asthma management.    Return to Care: Return in about 2 months (around 04/25/2021).  NLenice Llamas MD [facility] CC: [place]olene Provost [place]

## 2021-04-26 ENCOUNTER — Encounter: Payer: Self-pay | Admitting: Internal Medicine

## 2021-04-26 ENCOUNTER — Other Ambulatory Visit: Payer: Self-pay

## 2021-04-26 ENCOUNTER — Ambulatory Visit (INDEPENDENT_AMBULATORY_CARE_PROVIDER_SITE_OTHER): Payer: BC Managed Care – PPO | Admitting: Internal Medicine

## 2021-04-26 VITALS — BP 106/68 | HR 75 | Temp 98.9°F | Ht 64.0 in | Wt 179.0 lb

## 2021-04-26 DIAGNOSIS — J4530 Mild persistent asthma, uncomplicated: Secondary | ICD-10-CM

## 2021-04-26 DIAGNOSIS — J4540 Moderate persistent asthma, uncomplicated: Secondary | ICD-10-CM | POA: Diagnosis not present

## 2021-04-26 LAB — NITRIC OXIDE: Nitric Oxide: 19

## 2021-04-26 MED ORDER — MISC. DEVICES MISC: 0 refills | Status: DC

## 2021-04-26 MED ORDER — ADVAIR HFA 115-21 MCG/ACT IN AERO: 2.0000 | INHALATION_SPRAY | Freq: Two times a day (BID) | RESPIRATORY_TRACT | 3 refills | Status: DC

## 2021-04-26 NOTE — Progress Notes (Signed)
° °      °Brandy Sanchez    [id number]    03/18/1996 ° °Primary Care Physician:Panosh, Wanda K, MD °Date of Appointment: 04/26/2021 °Established Patient Visit ° °Chief complaint:   °Chief Complaint  °Patient presents with  ° Follow-up  °  She reports that she still feels more out of breath even taking inhalers.   ° ° ° °HPI: °Brandy Sanchez is a 25 y.o. woman with moderate persistent asthma ° °Interval Updates: °Here for follow up. Feels worse with flovent and advair.  °She is using albuterol and it does help. Uses 2-3 times/day.  ° ° °Current Regimen: prn albuterol °Asthma Triggers: exertion °Exacerbations in the last year: none °History of hospitalization or intubation: none °Allergy Testing: never had °GERD: none °Allergic Rhinitis: yes itchy eyes °ACT:  °Asthma Control Test ACT Total Score  °04/26/2021 19  ° °FeNO: 19 ppb ° °I have reviewed the patient's family social and past medical history and updated as appropriate.  ° °Past Medical History:  °Diagnosis Date  ° Allergic rhinitis   ° Anxiety   ° Viral thyroiditis 09/09/2013  ° ° °Past Surgical History:  °Procedure Laterality Date  ° CYST EXCISION Right 05/30/2018  ° Procedure: CYST REMOVAL RIGHT WRIST;  Surgeon: Kuzma, Gary, MD;  Location: [facility];  Service: Orthopedics;  Laterality: Right;  ° ° °Family History  °Problem Relation Age of Onset  ° Thyroid disease Mother   °     removed due to goiter and trouble swallowing  ° Hypertension Mother   ° Asthma Sister   ° Thyroid disease Maternal Grandmother   ° Diabetes Paternal Grandmother   ° Thyroid disease Maternal Aunt   °     removed- enlarged  ° ° °Social History  ° °Occupational History  ° Not on file  °Tobacco Use  ° Smoking status: Never  ° Smokeless tobacco: Never  °Vaping Use  ° Vaping Use: Never used  °Substance and Sexual Activity  ° Alcohol use: No  ° Drug use: No  ° Sexual activity: Yes  °  Birth control/protection: Pill  ° ° ° °Physical Exam: °Blood pressure 106/68, pulse 75,  temperature 98.9 °F (37.2 °C), temperature source Oral, height 5' 4" (1.626 m), weight 179 lb (81.2 kg), SpO2 99 %. ° °Gen:      No acute distress °ENT:  no nasal polyps, mucus membranes moist °Lungs:    No increased respiratory effort, symmetric chest wall excursion, clear to auscultation bilaterally, no wheezes or crackles °CV:         Regular rate and rhythm; no murmurs, rubs, or gallops.  No pedal edema ° ° °Data Reviewed: °Imaging: °I have personally reviewed the chest xray 11/2020 shows no acute process ° °PFTs: °No flowsheet data found. °I have personally reviewed the patient's PFTs and spirometry 04/26/2021 shows no airflow limitation.  ° °Labs: °Lab Results  °Component Value Date  ° WBC 5.9 02/15/2021  ° HGB 12.6 02/15/2021  ° HCT 37.3 02/15/2021  ° MCV 88.1 02/15/2021  ° PLT 417.0 (H) 02/15/2021  ° °Lab Results  °Component Value Date  ° NA 136 02/15/2021  ° K 3.9 02/15/2021  ° CL 104 02/15/2021  ° CO2 26 02/15/2021  ° ° ° °Immunization status: °Immunization History  °Administered Date(s) Administered  ° DTP 07/16/1997, 09/03/1997, 11/11/1997, 08/02/1998, 08/26/2002  ° HPV 9-valent 07/23/2015  ° HPV Quadrivalent 08/12/2010, 10/13/2010  ° Hepatitis A, Adult 07/23/2015  ° Hepatitis B 05/04/1997, 09/03/1997, 11/11/1997  °   HiB (PRP-OMP) 07/16/1997, 09/03/1997, 11/11/1997, 05/03/1998  ° Influenza Inj Mdck Quad Pf 04/09/2020, 02/06/2021  ° Influenza Split 01/18/2011, 01/15/2012  ° Influenza Whole 01/29/2009, 02/09/2010  ° Influenza,inj,Quad PF,6+ Mos 01/21/2013, 12/29/2014, 12/04/2016  ° MMR 05/03/1998, 08/26/2002  ° Meningococcal Conjugate 08/12/2010  ° Meningococcal Polysaccharide 08/26/2008  ° Moderna Sars-Covid-2 Vaccination 05/24/2019, 06/21/2019, 04/09/2020  ° OPV 07/16/1997, 09/03/1997, 05/03/1998, 08/26/2002  ° PPD Test 08/25/2013, 08/09/2016  ° Pfizer Covid-19 Vaccine Bivalent Booster 12yrs & up 02/06/2021  ° Pneumococcal Conjugate-13 12/27/1998, 02/08/1999  ° Td 08/26/2008  ° Tdap 10/02/2018  ° Varicella  05/03/1998, 08/26/2008  ° ° °External Records Personally Reviewed: PCP ° °Assessment:  °Mild persistent asthma, not well contrlolled °Peripheral eosinophilia °Allergic conjunctivitis ° °Plan/Recommendations: ° °Stop DPI inhalers flovent and advair. Switch to advair 230 HFA 2 puffs twice a day with spacer. We reviewd inhaler teaching today.  °If no improvement with ICS-LABA this time would consider methacholine challenge °Continue prn albuterol °Try anti-histamine for itchy eyes.  ° ° °Return to Care: °Return in about 2 months (around 06/24/2021). ° ° °Nikita Desai, MD °Pulmonary and Critical Care Medicine °[facility] °Office:[phone] ° ° ° ° ° °

## 2021-04-26 NOTE — Progress Notes (Signed)
The patient has been prescribed the inhaler advair HFA with spacer. Inhaler technique was demonstrated to patient. The patient subsequently demonstrated correct technique.

## 2021-04-26 NOTE — Patient Instructions (Addendum)
Please schedule follow up scheduled with myself in 2 months.  If my schedule is not open yet, we will contact you with a reminder closer to that time. Please call [phone] if you haven't heard from [place] a month before.   Try taking an over the counter anti-histamine like claritin, zyrtec, allegra - for your itchy eyes  Stop the flovent and advair disk. I am switching you to an advair HFA inhaler with spacer - this went to your [facility].  Take 2 puffs in the morning, 2 puffs at night. Gargle after use.   Keep taking albuterol as needed.   Take the albuterol rescue inhaler every 4 to 6 hours as needed for wheezing or shortness of breath. You can also take it 15 minutes before exercise or exertional activity. Side effects include heart racing or pounding, jitters or anxiety. If you have a history of an irregular heart rhythm, it can make this worse. Can also give some patients a hard time sleeping.  To inhale the aerosol using an inhaler, follow these steps:  Remove the protective dust cap from the end of the mouthpiece. If the dust cap was not placed on the mouthpiece, check the mouthpiece for dirt or other objects. Be sure that the canister is fully and firmly inserted in the mouthpiece. 2. If you are using the inhaler for the first time or if you have not used the inhaler in more than 14 days, you will need to prime it. You may also need to prime the inhaler if it has been dropped. Ask your pharmacist or check the manufacturer's information if this happens. To prime the inhaler, shake it well and then press down on the canister 4 times to release 4 sprays into the air, away from your face. Be careful not to get albuterol in your eyes. 3. Shake the inhaler well. 4. Breathe out as completely as possible through your mouth. 4. Hold the canister with the mouthpiece on the bottom, facing you and the canister pointing upward. Place the open end of the mouthpiece into your mouth. Close your lips  tightly around the mouthpiece. 6. Breathe in slowly and deeply through the mouthpiece.At the same time, press down once on the container to spray the medication into your mouth. 7. Try to hold your breath for 10 seconds. remove the inhaler, and breathe out slowly. 8. If you were told to use 2 puffs, wait 1 minute and then repeat steps 3-7. 9. Replace the protective cap on the inhaler. 10. Clean your inhaler regularly. Follow the manufacturer's directions carefully and ask your doctor or pharmacist if you have any questions about cleaning your inhaler.  Check the back of the inhaler to keep track of the total number of doses left on the inhaler.

## 2021-05-18 ENCOUNTER — Encounter: Payer: Self-pay | Admitting: Internal Medicine

## 2021-05-18 ENCOUNTER — Ambulatory Visit: Payer: BC Managed Care – PPO | Admitting: Internal Medicine

## 2021-05-18 VITALS — BP 120/80 | HR 78 | Temp 98.9°F | Ht 64.0 in | Wt 175.2 lb

## 2021-05-18 DIAGNOSIS — R3989 Other symptoms and signs involving the genitourinary system: Secondary | ICD-10-CM

## 2021-05-18 LAB — POCT URINALYSIS DIPSTICK
Bilirubin, UA: NEGATIVE
Blood, UA: POSITIVE
Glucose, UA: NEGATIVE
Ketones, UA: NEGATIVE
Nitrite, UA: POSITIVE
Protein, UA: POSITIVE — AB
Spec Grav, UA: 1.025 (ref 1.010–1.025)
Urobilinogen, UA: NEGATIVE E.U./dL — AB
pH, UA: 6 (ref 5.0–8.0)

## 2021-05-18 LAB — POCT URINE PREGNANCY: Preg Test, Ur: NEGATIVE

## 2021-05-18 MED ORDER — NITROFURANTOIN MONOHYD MACRO 100 MG PO CAPS: 100.0000 mg | ORAL_CAPSULE | Freq: Two times a day (BID) | ORAL | 0 refills | Status: AC

## 2021-05-18 NOTE — Telephone Encounter (Signed)
Left a message for the pt to return my call.  

## 2021-05-18 NOTE — Progress Notes (Signed)
? ?Chief Complaint  ?Patient presents with  ? Dysuria  ?  2 days  ? ? ?HPI: ?Brandy Sanchez 26 y.o. come in for  2 days of urinay sx similar to when had uti but no full dysuria but has  sx end urination.  No nvflank pain or fever  may have had  some pink blood last pm.  Has suprapubic tenderness  . But no other abd pain  ? ?No vag sx  sx of sti  lmp feb 16  on time gets light periods on  OCPS . No missed pills.  ? ?ROS: See pertinent positives and negatives per HPI. ? ?Past Medical History:  ?Diagnosis Date  ? Allergic rhinitis   ? Anxiety   ? Viral thyroiditis 09/09/2013  ? ? ?Family History  ?Problem Relation Age of Onset  ? Thyroid disease Mother   ?     removed due to goiter and trouble swallowing  ? Hypertension Mother   ? Asthma Sister   ? Thyroid disease Maternal Grandmother   ? Diabetes Paternal Grandmother   ? Thyroid disease Maternal Aunt   ?     removed- enlarged  ? ? ?Social History  ? ?Socioeconomic History  ? Marital status: Single  ?  Spouse name: Not on file  ? Number of children: Not on file  ? Years of education: Not on file  ? Highest education level: Not on file  ?Occupational History  ? Not on file  ?Tobacco Use  ? Smoking status: Never  ? Smokeless tobacco: Never  ?Vaping Use  ? Vaping Use: Never used  ?Substance and Sexual Activity  ? Alcohol use: No  ? Drug use: No  ? Sexual activity: Yes  ?  Birth control/protection: Pill  ?Other Topics Concern  ? Not on file  ?Social History Narrative  ? Intact family  ? HH of 4  Lives with parents and 1 sister  ?      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 Spanish immersion in elementary  ? Cheerleading and softball   ? Works hh  ? Back at home to graduate in dec 21  ?   ? ?Social Determinants of Health  ? ?Financial Resource Strain: Not on file  ?Food Insecurity: Not on file  ?Transportation Needs: Not on file  ?Physical Activity: Not on file  ?Stress: Not on file  ?Social Connections: Not on file  ? ? ?Outpatient Medications Prior to Visit   ?Medication Sig Dispense Refill  ? fluticasone-salmeterol (ADVAIR HFA) 115-21 MCG/ACT inhaler Inhale 2 puffs into the lungs 2 (two) times daily. 1 each 3  ? levalbuterol (XOPENEX HFA) 45 MCG/ACT inhaler Inhale 2 puffs into the lungs every 6 (six) hours as needed for wheezing. 15 g 0  ? levonorgestrel-ethinyl estradiol (LARISSIA) 0.1-20 MG-MCG tablet Take 1 tablet by mouth daily. 84 tablet 2  ? Misc. Devices MISC Please dispense one spacer 1 each 0  ? ?No facility-administered medications prior to visit.  ? ? ? ?EXAM: ? ?BP 120/80 (BP Location: Left Arm, Patient Position: Sitting, Cuff Size: Normal)   Pulse 78   Temp 98.9 ?F (37.2 ?C) (Oral)   Ht 5\' 4"  (1.626 m)   Wt 175 lb 3.2 oz (79.5 kg)   LMP 05/05/2021   SpO2 99%   BMI 30.07 kg/m?  ? ?Body mass index is 30.07 kg/m?. ? ?GENERAL: vitals reviewed and listed above, alert, oriented, appears well hydrated  and in no acute distress ?HEENT: atraumatic, conjunctiva  clear, no obvious abnormalities on inspection of external nose and ears OP :makes d ?NECK: no obvious masses on inspection palpation  ?Abdomen:  Sof,t normal bowel sounds without hepatosplenomegaly, no guarding rebound or masses no CVA tenderness ?MS: moves all extremities without noticeable focal  abnormality ?PSYCH: pleasant and cooperative, no obvious depression or anxiety ?Lab Results  ?Component Value Date  ? WBC 5.9 02/15/2021  ? HGB 12.6 02/15/2021  ? HCT 37.3 02/15/2021  ? PLT 417.0 (H) 02/15/2021  ? GLUCOSE 74 02/15/2021  ? CHOL 131 10/28/2019  ? TRIG 51 10/28/2019  ? HDL 56 10/28/2019  ? LDLCALC 62 10/28/2019  ? ALT 24 10/28/2019  ? AST 23 10/28/2019  ? NA 136 02/15/2021  ? K 3.9 02/15/2021  ? CL 104 02/15/2021  ? CREATININE 0.94 02/15/2021  ? BUN 8 02/15/2021  ? CO2 26 02/15/2021  ? TSH 1.45 02/15/2021  ? ?BP Readings from Last 3 Encounters:  ?05/18/21 120/80  ?04/26/21 106/68  ?02/22/21 126/78  ?Urinalysis ?   ?Component Value Date/Time  ? BILIRUBINUR neg 05/18/2021 1020  ? KETONESUR trace  (5) (A) 07/20/2016 1034  ? PROTEINUR Positive (A) 05/18/2021 1020  ? UROBILINOGEN negative (A) 05/18/2021 1020  ? NITRITE positive 05/18/2021 1020  ? LEUKOCYTESUR Moderate (2+) (A) 05/18/2021 1020  ? ? ? ?ASSESSMENT AND PLAN: ? ?Discussed the following assessment and plan: ? ?Suspected UTI - Plan: POC Urinalysis Dipstick, Culture, Urine, POCT urine pregnancy, Culture, Urine, CANCELED: POCT urine pregnancy ?Empiric rx  antibiotic  pending cx  counseled  about uti risk factors etc.  ?-Patient advised to return or notify health care team  if  new concerns arise. ? ?Patient Instructions  ?Uti  most likely   ? ?Begin antibiotic and will be notified about culture results . ?Let us know if  persistent or progressive symptoms  ? ? ?Neta Mends. Tommie Dejoseph M.D. ?

## 2021-05-18 NOTE — Patient Instructions (Addendum)
Uti  most likely   ? ?Begin antibiotic and will be notified about culture results . ?Let us know if  persistent or progressive symptoms  ?

## 2021-05-18 NOTE — Telephone Encounter (Signed)
Pt has been added to schedule ?

## 2021-05-20 LAB — URINE CULTURE
MICRO NUMBER:: 13073869
SPECIMEN QUALITY:: ADEQUATE

## 2021-05-23 NOTE — Progress Notes (Signed)
urine culture shows e coli  sensitive to medication given . Should resolve with current treatment .FU if not better. 

## 2021-05-30 ENCOUNTER — Other Ambulatory Visit: Payer: Self-pay | Admitting: Internal Medicine

## 2021-05-31 ENCOUNTER — Encounter: Payer: Self-pay | Admitting: Internal Medicine

## 2021-05-31 ENCOUNTER — Other Ambulatory Visit: Payer: Self-pay

## 2021-05-31 MED ORDER — LEVONORGESTREL-ETHINYL ESTRAD 0.1-20 MG-MCG PO TABS: 1.0000 | ORAL_TABLET | Freq: Every day | ORAL | 2 refills | Status: DC

## 2021-09-28 ENCOUNTER — Telehealth: Payer: BC Managed Care – PPO | Admitting: Physician Assistant

## 2021-09-28 DIAGNOSIS — R3989 Other symptoms and signs involving the genitourinary system: Secondary | ICD-10-CM | POA: Diagnosis not present

## 2021-09-28 MED ORDER — CEPHALEXIN 500 MG PO CAPS: 500.0000 mg | ORAL_CAPSULE | Freq: Two times a day (BID) | ORAL | 0 refills | Status: AC

## 2021-09-28 NOTE — Progress Notes (Signed)

## 2021-09-28 NOTE — Progress Notes (Signed)
I have spent 5 minutes in review of e-visit questionnaire, review and updating patient chart, medical decision making and response to patient.   Eloyce Bultman Cody Kannen Moxey, PA-C    

## 2021-10-19 IMAGING — DX DG CHEST 2V
2 series · 2 of 2 positions shown · non-contrast
Comparison: 05/06/2015

CLINICAL DATA: Stridor, wheezing, COVID Herrie

EXAM:
CHEST - 2 VIEW

[chest pa]
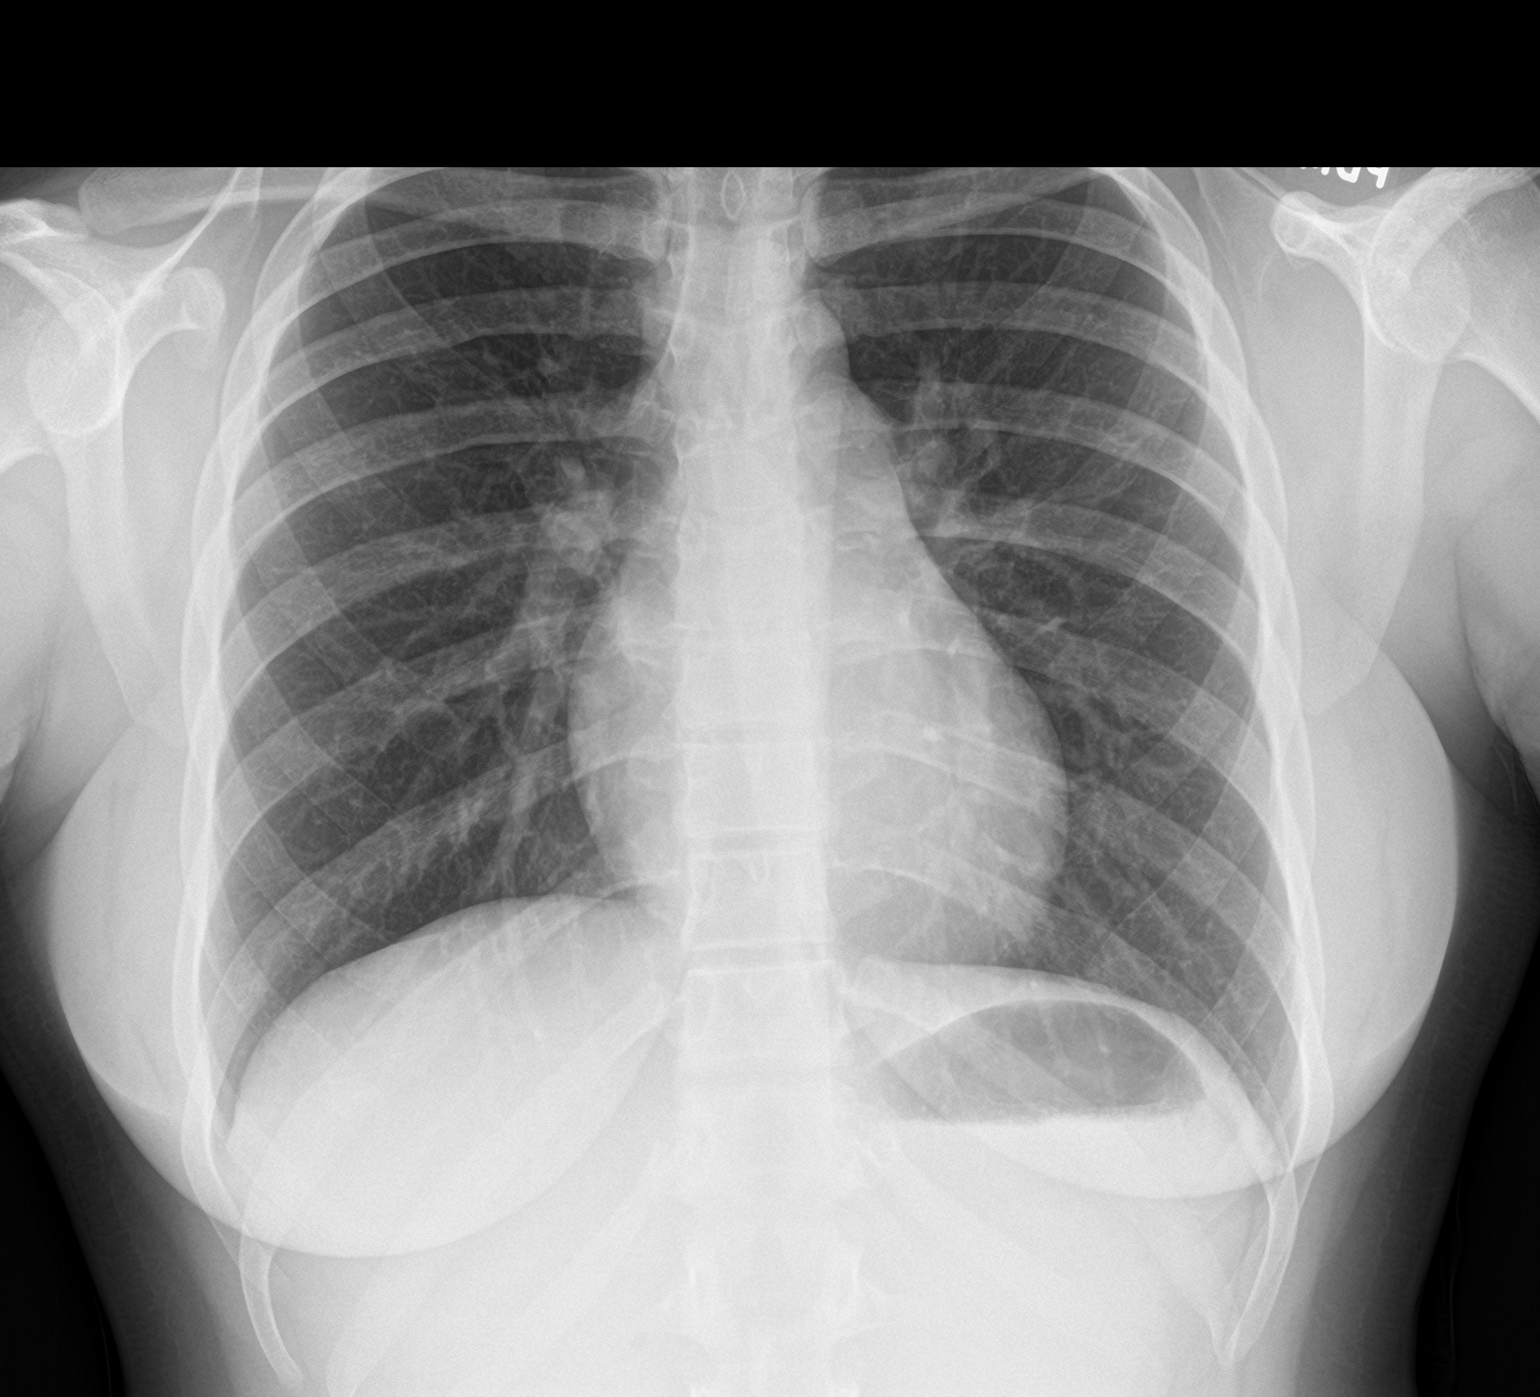

[chest lat]
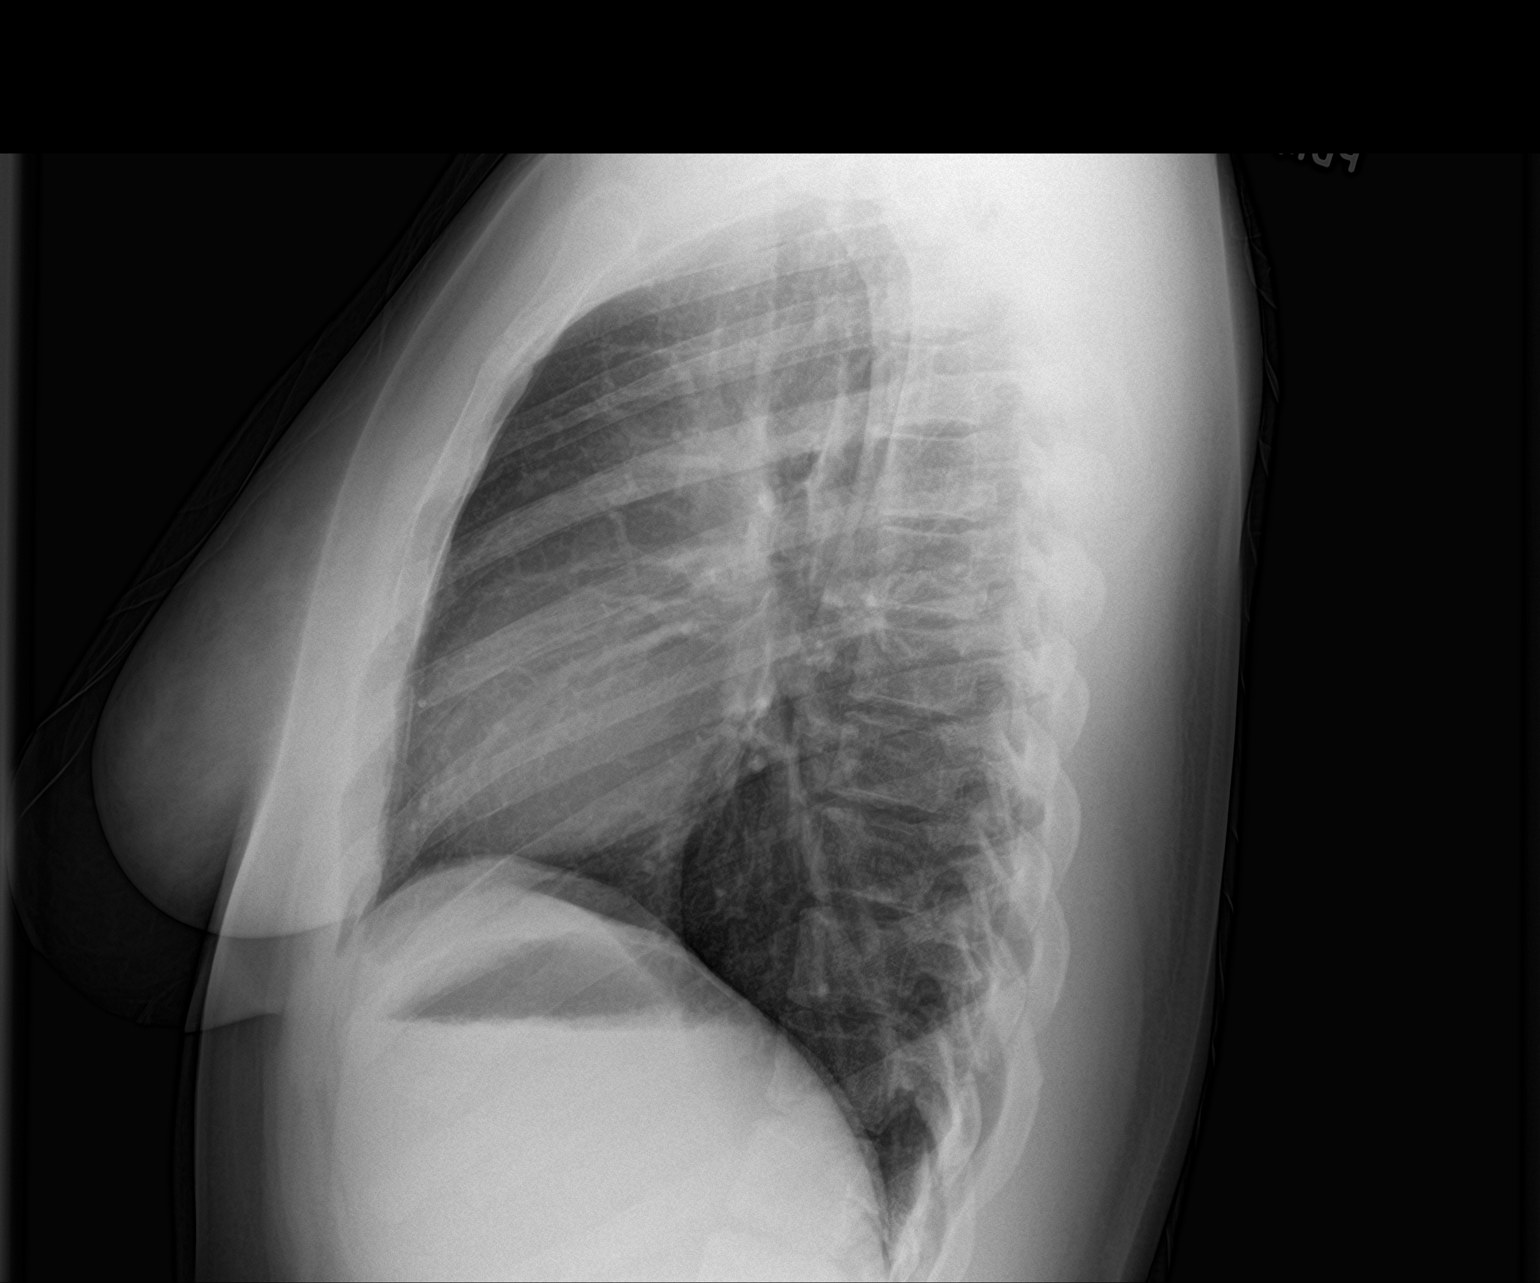

[2 of 2 positions shown; findings below may reference images not displayed]

FINDINGS: The heart size and mediastinal contours are within normal limits.
Both lungs are clear. The visualized skeletal structures are
unremarkable.
IMPRESSION: No acute abnormality of the lungs.

## 2022-02-08 ENCOUNTER — Other Ambulatory Visit: Payer: Self-pay | Admitting: Internal Medicine

## 2022-02-21 ENCOUNTER — Telehealth: Payer: BC Managed Care – PPO | Admitting: Physician Assistant

## 2022-02-21 DIAGNOSIS — J019 Acute sinusitis, unspecified: Secondary | ICD-10-CM | POA: Diagnosis not present

## 2022-02-21 DIAGNOSIS — B9789 Other viral agents as the cause of diseases classified elsewhere: Secondary | ICD-10-CM | POA: Diagnosis not present

## 2022-02-21 MED ORDER — FLUTICASONE PROPIONATE 50 MCG/ACT NA SUSP: 2.0000 | Freq: Every day | NASAL | 0 refills | Status: DC

## 2022-02-21 NOTE — Progress Notes (Signed)
I have spent 5 minutes in review of e-visit questionnaire, review and updating patient chart, medical decision making and response to patient.   Tamarcus Condie Cody Patterson Hollenbaugh, PA-C    

## 2022-02-21 NOTE — Progress Notes (Signed)

## 2022-02-22 ENCOUNTER — Encounter (HOSPITAL_COMMUNITY): Payer: Self-pay

## 2022-02-22 ENCOUNTER — Ambulatory Visit (HOSPITAL_COMMUNITY)
Admission: EM | Admit: 2022-02-22 | Discharge: 2022-02-22 | Disposition: A | Discharge status: Home / Self Care | Payer: BC Managed Care – PPO

## 2022-02-22 DIAGNOSIS — H66002 Acute suppurative otitis media without spontaneous rupture of ear drum, left ear: Secondary | ICD-10-CM

## 2022-02-22 DIAGNOSIS — J019 Acute sinusitis, unspecified: Secondary | ICD-10-CM

## 2022-02-22 MED ORDER — AMOXICILLIN 500 MG PO CAPS: 500.0000 mg | ORAL_CAPSULE | Freq: Two times a day (BID) | ORAL | 0 refills | Status: AC

## 2022-02-22 NOTE — ED Provider Notes (Signed)
[facility]    CSN: [id number] Arrival date & time: 02/22/22  0805     History   Chief Complaint Chief Complaint  Patient presents with   Nasal Congestion    HPI Brandy Sanchez is a 26 y.o. female.  Presents with 5 day history of sinus congestion, nasal drainage, left ear pain Ear pain has persistently worsened. Today 8/10 pain No fevers or chills No cough No known sick contacts Has been using sudafed without relief  Reports recurrent sinus issues  Past Medical History:  Diagnosis Date   Allergic rhinitis    Anxiety    Viral thyroiditis 09/09/2013    Patient Active Problem List   Diagnosis Date Noted   Abnormal thyroid blood test 05/22/2013   Goiter diffuse 01/21/2013   ALLERGIC RHINITIS 08/26/2007    Past Surgical History:  Procedure Laterality Date   CYST EXCISION Right 05/30/2018   Procedure: CYST REMOVAL RIGHT WRIST;  Surgeon: [name], MD;  Location: [facility];  Service: Orthopedics;  Laterality: Right;    OB History   No obstetric history on file.      Home Medications    Prior to Admission medications   Medication Sig Start Date End Date Taking? Authorizing Provider  amoxicillin (AMOXIL) 500 MG capsule Take 1 capsule (500 mg total) by mouth 2 (two) times daily for 5 days. 02/22/22 02/27/22 Yes Hadas Jessop, Lurena Joiner, PA-C  azaTHIOprine (IMURAN) 50 MG tablet Take 1 tablet by mouth daily. 02/20/22  Yes [provider]  escitalopram (LEXAPRO) 5 MG tablet Take by mouth. 03/21/18  Yes [provider]  fluticasone (FLONASE) 50 MCG/ACT nasal spray Place 2 sprays into both nostrils daily. 02/21/22   Waldon Merl, PA-C  Misc. Devices MISC Please dispense one spacer 04/26/21   Charlott Holler, MD    Family History Family History  Problem Relation Age of Onset   Thyroid disease Mother        removed due to goiter and trouble swallowing   Hypertension Mother    Asthma Sister    Thyroid disease Maternal Grandmother     Diabetes Paternal Grandmother    Thyroid disease Maternal Aunt        removed- enlarged    Social History Social History   Tobacco Use   Smoking status: Never   Smokeless tobacco: Never  Vaping Use   Vaping Use: Never used  Substance Use Topics   Alcohol use: No   Drug use: No     Allergies   Patient has no known allergies.   Review of Systems Review of Systems Per HPI  Physical Exam Triage Vital Signs ED Triage Vitals [02/22/22 0835]  Enc Vitals Group     BP (!) 142/85     Pulse Rate 99     Resp 16     Temp 98.5 F (36.9 C)     Temp Source Oral     SpO2 99 %     Weight      Height      Head Circumference      Peak Flow      Pain Score 8     Pain Loc      Pain Edu?      Excl. in GC?    No data found.  Updated Vital Signs BP (!) 142/85 (BP Location: Left Arm)   Pulse 99   Temp 98.5 F (36.9 C) (Oral)   Resp 16   LMP 02/09/2022 (Exact Date)  SpO2 99%    Physical Exam Vitals and nursing note reviewed.  Constitutional:      General: She is not in acute distress. HENT:     Left Ear: Tympanic membrane is erythematous and bulging.     Ears:     Comments: Purulent fluid behind R TM, L is erythematous and bulging     Nose: Congestion present.     Mouth/Throat:     Mouth: Mucous membranes are moist.     Pharynx: Oropharynx is clear.  Eyes:     Conjunctiva/sclera: Conjunctivae normal.  Cardiovascular:     Rate and Rhythm: Normal rate and regular rhythm.     Heart sounds: Normal heart sounds.  Pulmonary:     Effort: Pulmonary effort is normal.     Breath sounds: Normal breath sounds.  Lymphadenopathy:     Cervical: No cervical adenopathy.  Neurological:     Mental Status: She is alert and oriented to person, place, and time.     UC Treatments / Results  Labs (all labs ordered are listed, but only abnormal results are displayed) Labs Reviewed - No data to display  EKG  Radiology No results  found.  Procedures Procedures  Medications Ordered in UC Medications - No data to display  Initial Impression / Assessment and Plan / UC Course  I have reviewed the triage vital signs and the nursing notes.  Pertinent labs & imaging results that were available during my care of the patient were reviewed by me and considered in my medical decision making (see chart for details).  Left otitis media Amox BID x 5 days Discussed continuing sudafed/nasal spray for nasal congestion as well ENT info given for follow up as needed Return precautions discussed. Patient agrees to plan  Final Clinical Impressions(s) / UC Diagnoses   Final diagnoses:  Acute suppurative otitis media of left ear without spontaneous rupture of tympanic membrane, recurrence not specified  Acute sinusitis, recurrence not specified, unspecified location     Discharge Instructions      Please take medication as prescribed. Take with food to avoid upset stomach.  Continue other symptomatic care as needed    ED Prescriptions     Medication Sig Dispense Auth. Provider   amoxicillin (AMOXIL) 500 MG capsule Take 1 capsule (500 mg total) by mouth 2 (two) times daily for 5 days. 10 capsule Jennice Renegar, Lurena Joiner, PA-C      PDMP not reviewed this encounter.   Marlow Baars, [place] 02/22/22 6010

## 2022-02-22 NOTE — Discharge Instructions (Addendum)
Please take medication as prescribed. Take with food to avoid upset stomach.  Continue other symptomatic care as needed

## 2022-02-22 NOTE — ED Triage Notes (Signed)
Pt states congestion and left ear pain for the past 5 days. Has been taking sudafed at home with no relief.

## 2022-04-30 ENCOUNTER — Other Ambulatory Visit: Payer: Self-pay | Admitting: Internal Medicine

## 2022-04-30 DIAGNOSIS — Z3041 Encounter for surveillance of contraceptive pills: Secondary | ICD-10-CM

## 2022-05-04 NOTE — Telephone Encounter (Signed)
Contacted pt. Appt is made on 06/06/22

## 2022-06-05 NOTE — Progress Notes (Unsigned)
No chief complaint on file.   HPI: Patient  Brandy Sanchez  27 y.o. comes in today for Preventive Health Care visit    Eye focal chorioretinal inflammation retinal edema  Health Maintenance  Topic Date Due   Hepatitis C Screening  Never done   INFLUENZA VACCINE  10/18/2021   COVID-19 Vaccine (5 - 2023-24 season) 11/18/2021   PAP-Cervical Cytology Screening  06/15/2023   PAP SMEAR-Modifier  06/15/2023   DTaP/Tdap/Td (8 - Td or Tdap) 10/01/2028   HPV VACCINES  Completed   HIV Screening  Completed   Health Maintenance Review LIFESTYLE:  Exercise:   Tobacco/ETS: Alcohol:  Sugar beverages: Sleep: Drug use: no HH of  Work:    ROS:  GEN/ HEENT: No fever, significant weight changes sweats headaches vision problems hearing changes, CV/ PULM; No chest pain shortness of breath cough, syncope,edema  change in exercise tolerance. GI /GU: No adominal pain, vomiting, change in bowel habits. No blood in the stool. No significant GU symptoms. SKIN/HEME: ,no acute skin rashes suspicious lesions or bleeding. No lymphadenopathy, nodules, masses.  NEURO/ PSYCH:  No neurologic signs such as weakness numbness. No depression anxiety. IMM/ Allergy: No unusual infections.  Allergy .   REST of 12 system review negative except as per HPI   Past Medical History:  Diagnosis Date   Allergic rhinitis    Anxiety    Viral thyroiditis 09/09/2013    Past Surgical History:  Procedure Laterality Date   CYST EXCISION Right 05/30/2018   Procedure: CYST REMOVAL RIGHT WRIST;  Surgeon: Daryll Brod, MD;  Location: [facility];  Service: Orthopedics;  Laterality: Right;    Family History  Problem Relation Age of Onset   Thyroid disease Mother        removed due to goiter and trouble swallowing   Hypertension Mother    Asthma Sister    Thyroid disease Maternal Grandmother    Diabetes Paternal Grandmother    Thyroid disease Maternal Aunt        removed- enlarged    Social History    Socioeconomic History   Marital status: Single    Spouse name: Not on file   Number of children: Not on file   Years of education: Not on file   Highest education level: Not on file  Occupational History   Not on file  Tobacco Use   Smoking status: Never   Smokeless tobacco: Never  Vaping Use   Vaping Use: Never used  Substance and Sexual Activity   Alcohol use: No   Drug use: No   Sexual activity: Yes    Birth control/protection: Pill  Other Topics Concern   Not on file  Social History Narrative   Intact family   HH of 4  Lives with parents and 1 sister        [employer]  A and bs  At [employer] in public health --    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     escitalopram (LEXAPRO) 5 MG tablet Take by mouth.     fluticasone (FLONASE) 50 MCG/ACT nasal spray Place 2 sprays into both nostrils daily. 16 g 0   levonorgestrel-ethinyl estradiol (SRONYX) 0.1-20 MG-MCG tablet Take 1 tablet by mouth daily. *APPOINTMENT REQUIRED FOR FUTURE REFILLS** 84 tablet 0   Misc. Devices MISC Please dispense one spacer 1 each 0   No facility-administered medications prior to visit.     EXAM:  There were no vitals taken for this visit.  There is no height or weight on file to calculate BMI. Wt Readings from Last 3 Encounters:  05/18/21 175 lb 3.2 oz (79.5 kg)  04/26/21 179 lb (81.2 kg)  02/22/21 175 lb 6.4 oz (79.6 kg)    Physical Exam: Vital signs reviewed [id number] is a well-developed well-nourished alert cooperative    who appearsr stated age in  no acute distress.  HEENT: normocephalic atraumatic , Eyes: PERRL EOM's full, conjunctiva clear, Nares: paten,t no deformity discharge or tenderness., Ears: no deformity EAC's clear TMs with normal landmarks. Mouth: clear OP, no lesions, edema.  Moist mucous membranes. Dentition in adequate repair. NECK: supple without masses, thyromegaly or bruits. CHEST/PULM:  Clear to auscultation and percussion breath sounds equal no wheeze , rales or rhonchi. No chest wall deformities or tenderness. Breast: normal by inspection . No dimpling, discharge, masses, tenderness or discharge . CV: PMI is nondisplaced, S1 S2 no gallops, murmurs, rubs. Peripheral pulses are full without delay.No JVD .  ABDOMEN: Bowel sounds normal nontender  No guard or rebound, no hepato splenomegal no CVA tenderness.  No hernia. Extremtities:  No clubbing cyanosis or edema, no acute joint swelling or redness no focal atrophy NEURO:  Oriented x3, cranial nerves 3-12 appear to be intact, no obvious focal weakness,gait within normal limits no abnormal reflexes or asymmetrical SKIN: No acute rashes normal turgor, color, no bruising or petechiae. PSYCH: Oriented, good eye contact, no obvious depression anxiety, cognition and judgment appear normal. LN: no cervical axillary inguinal adenopathy  Lab Results  Component Value Date   WBC 5.9 02/15/2021   HGB 12.6 02/15/2021   HCT 37.3 02/15/2021   PLT 417.0 (H) 02/15/2021   GLUCOSE 74 02/15/2021   CHOL 131 10/28/2019   TRIG 51 10/28/2019   HDL 56 10/28/2019   LDLCALC 62 10/28/2019   ALT 24 10/28/2019   AST 23 10/28/2019   NA 136 02/15/2021   K 3.9 02/15/2021   CL 104 02/15/2021   CREATININE 0.94 02/15/2021   BUN 8 02/15/2021   CO2 26 02/15/2021   TSH 1.45 02/15/2021    BP Readings from Last 3 Encounters:  02/22/22 (!) 142/85  05/18/21 120/80  04/26/21 106/68    Lab results reviewed with patient   ASSESSMENT AND PLAN:  Discussed the following assessment and plan:     ICD-10-CM   1. Visit for preventive health examination  Z00.00     2. Medication management  Z79.899      No follow-ups on file.  Patient Care Team: Burnis Medin, MD as PCP - General There are no Patient Instructions on file for this visit.  Standley Brooking. Zailee Vallely M.D.

## 2022-06-06 ENCOUNTER — Ambulatory Visit (INDEPENDENT_AMBULATORY_CARE_PROVIDER_SITE_OTHER): Payer: BC Managed Care – PPO | Admitting: Internal Medicine

## 2022-06-06 ENCOUNTER — Encounter: Payer: Self-pay | Admitting: Internal Medicine

## 2022-06-06 VITALS — BP 118/60 | HR 99 | Temp 98.3°F | Ht 65.0 in | Wt 188.6 lb

## 2022-06-06 DIAGNOSIS — Z3041 Encounter for surveillance of contraceptive pills: Secondary | ICD-10-CM | POA: Diagnosis not present

## 2022-06-06 DIAGNOSIS — H3090 Unspecified chorioretinal inflammation, unspecified eye: Secondary | ICD-10-CM

## 2022-06-06 DIAGNOSIS — Z79899 Other long term (current) drug therapy: Secondary | ICD-10-CM

## 2022-06-06 DIAGNOSIS — R635 Abnormal weight gain: Secondary | ICD-10-CM | POA: Diagnosis not present

## 2022-06-06 DIAGNOSIS — Z0000 Encounter for general adult medical examination without abnormal findings: Secondary | ICD-10-CM | POA: Diagnosis not present

## 2022-06-06 DIAGNOSIS — E040 Nontoxic diffuse goiter: Secondary | ICD-10-CM | POA: Diagnosis not present

## 2022-06-06 LAB — COMPREHENSIVE METABOLIC PANEL
ALT: 22 U/L (ref 0–35)
AST: 22 U/L (ref 0–37)
Albumin: 4.4 g/dL (ref 3.5–5.2)
Alkaline Phosphatase: 58 U/L (ref 39–117)
BUN: 13 mg/dL (ref 6–23)
CO2: 26 mEq/L (ref 19–32)
Calcium: 9.9 mg/dL (ref 8.4–10.5)
Chloride: 103 mEq/L (ref 96–112)
Creatinine, Ser: 0.93 mg/dL (ref 0.40–1.20)
GFR: 85.68 mL/min (ref >60.00)
Glucose, Bld: 86 mg/dL (ref 70–99)
Potassium: 4.6 mEq/L (ref 3.5–5.1)
Sodium: 138 mEq/L (ref 135–145)
Total Bilirubin: 0.6 mg/dL (ref 0.2–1.2)
Total Protein: 7.7 g/dL (ref 6.0–8.3)

## 2022-06-06 LAB — CBC WITH DIFFERENTIAL/PLATELET
Basophils Absolute: 0 10*3/uL (ref 0.0–0.1)
Basophils Relative: 0.8 % (ref 0.0–3.0)
Eosinophils Absolute: 0.2 10*3/uL (ref 0.0–0.7)
Eosinophils Relative: 4.2 % (ref 0.0–5.0)
HCT: 41.2 % (ref 36.0–46.0)
Hemoglobin: 13.9 g/dL (ref 12.0–15.0)
Lymphocytes Relative: 31.2 % (ref 12.0–46.0)
Lymphs Abs: 1.4 10*3/uL (ref 0.7–4.0)
MCHC: 33.8 g/dL (ref 30.0–36.0)
MCV: 89.1 fl (ref 78.0–100.0)
Monocytes Absolute: 0.3 10*3/uL (ref 0.1–1.0)
Monocytes Relative: 6.3 % (ref 3.0–12.0)
Neutro Abs: 2.6 10*3/uL (ref 1.4–7.7)
Neutrophils Relative %: 57.5 % (ref 43.0–77.0)
Platelets: 489 10*3/uL — ABNORMAL HIGH (ref 150.0–400.0)
RBC: 4.62 Mil/uL (ref 3.87–5.11)
RDW: 12.9 % (ref 11.5–15.5)
WBC: 4.5 10*3/uL (ref 4.0–10.5)

## 2022-06-06 LAB — LIPID PANEL
Cholesterol: 127 mg/dL (ref 0–200)
HDL: 62.3 mg/dL (ref >39.00)
LDL Cholesterol: 40 mg/dL (ref 0–99)
NonHDL: 64.78
Total CHOL/HDL Ratio: 2
Triglycerides: 122 mg/dL (ref 0.0–149.0)
VLDL: 24.4 mg/dL (ref 0.0–40.0)

## 2022-06-06 LAB — T4, FREE: Free T4: 0.82 ng/dL (ref 0.60–1.60)

## 2022-06-06 LAB — TSH: TSH: 0.63 u[IU]/mL (ref 0.35–5.50)

## 2022-06-06 LAB — HEMOGLOBIN A1C: Hgb A1c MFr Bld: 5.4 % (ref 4.6–6.5)

## 2022-06-06 MED ORDER — LEVONORGESTREL-ETHINYL ESTRAD 0.1-20 MG-MCG PO TABS: 1.0000 | ORAL_TABLET | Freq: Every day | ORAL | 3 refills | Status: DC

## 2022-06-06 NOTE — Patient Instructions (Addendum)
Good to see you today  Exam is normal and reassuring Lab today  will include thryoid  diabetes check  Last PAP normal 3 2022 due  3 2025  Keep [place] informed .

## 2022-06-07 LAB — IRON,TIBC AND FERRITIN PANEL
%SAT: 20 % (calc) (ref 16–45)
Ferritin: 149 ng/mL (ref 16–154)
Iron: 80 ug/dL (ref 40–190)
TIBC: 394 mcg/dL (calc) (ref 250–450)

## 2022-06-07 LAB — HEPATITIS C ANTIBODY: Hepatitis C Ab: NONREACTIVE

## 2022-11-07 ENCOUNTER — Telehealth (INDEPENDENT_AMBULATORY_CARE_PROVIDER_SITE_OTHER): Payer: BC Managed Care – PPO | Admitting: Internal Medicine

## 2022-11-07 ENCOUNTER — Encounter: Payer: Self-pay | Admitting: Internal Medicine

## 2022-11-07 VITALS — Ht 65.0 in | Wt 188.0 lb

## 2022-11-07 DIAGNOSIS — R0683 Snoring: Secondary | ICD-10-CM

## 2022-11-07 DIAGNOSIS — R0600 Dyspnea, unspecified: Secondary | ICD-10-CM

## 2022-11-07 DIAGNOSIS — R221 Localized swelling, mass and lump, neck: Secondary | ICD-10-CM

## 2022-11-07 DIAGNOSIS — G4730 Sleep apnea, unspecified: Secondary | ICD-10-CM

## 2022-11-07 DIAGNOSIS — E040 Nontoxic diffuse goiter: Secondary | ICD-10-CM | POA: Diagnosis not present

## 2022-11-07 NOTE — Progress Notes (Signed)
Virtual Visit via Video Note  I connected with Brandy Sanchez on 11/07/22 at 11:00 AM EDT by a video enabled telemedicine application and verified that I am speaking with the correct person using two identifiers. Location patient: home Location provider:work office Persons participating in the virtual visit: patient, provider   Patient aware  of the limitations of evaluation and management by telemedicine and  availability of in person appointments. and agreed to proceed.   HPI: Brandy Sanchez presents for video visit More recent  parents have   reported   gasping breathing problem snore when going to sleep and she herself notes awakening with hard to breath  prob for a year but worse in past few months   Review  record notes  stridor day time after covid infection had ent eval and pulm rx asthmatic . 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  Describes some wheezing   ROS: See pertinent positives and negatives per HPI.  Past Medical History:  Diagnosis Date   Allergic rhinitis    Anxiety    Viral thyroiditis 09/09/2013    Past Surgical History:  Procedure Laterality Date   CYST EXCISION Right 05/30/2018   Procedure: CYST REMOVAL RIGHT WRIST;  Surgeon: [name], MD;  Location: [facility];  Service: Orthopedics;  Laterality: Right;    Family History  Problem Relation Age of Onset   Thyroid disease Mother        removed due to goiter and trouble swallowing   Hypertension Mother    Asthma Sister    Thyroid disease Maternal Grandmother    Diabetes Paternal Grandmother    Thyroid disease Maternal Aunt        removed- enlarged    Social History   Tobacco Use   Smoking status: Never   Smokeless tobacco: Never  Vaping Use   Vaping status: Never Used  Substance Use Topics   Alcohol use:  No   Drug use: No      Current Outpatient Medications:    azaTHIOprine (IMURAN) 50 MG tablet, Take 1 tablet by mouth daily., Disp: , Rfl:    fluticasone (FLONASE) 50 MCG/ACT nasal spray, Place 2 sprays into both nostrils daily., Disp: 16 g, Rfl: 0   levonorgestrel-ethinyl estradiol (SRONYX) 0.1-20 MG-MCG tablet, Take 1 tablet by mouth daily., Disp: 84 tablet, Rfl: 3  EXAM: BP Readings from Last 3 Encounters:  06/06/22 118/60  02/22/22 (!) 142/85  05/18/21 120/80   Says weight up to  190 195 range  VITALS per patient if applicable:  GENERAL: alert, oriented, appears well and in no acute distress  HEENT: atraumatic, conjunttiva clear, no obvious abnormalities on inspection of external nose and ears  NECK: normal movements of the head and neck  LUNGS: on inspection no signs of respiratory distress, breathing rate appears normal, no obvious gross SOB, gasping or wheezing  CV: no obvious cyanosis  MS: moves all visible extremities without noticeable abnormality  PSYCH/NEURO: pleasant and cooperative, no obvious depression or anxiety, speech and thought processing grossly intact Lab Results  Component Value Date   WBC 4.5 06/06/2022   HGB 13.9 06/06/2022   HCT 41.2 06/06/2022   PLT 489.0 (H) 06/06/2022   GLUCOSE 86 06/06/2022   CHOL 127 06/06/2022   TRIG  122.0 06/06/2022   HDL 62.30 06/06/2022   LDLCALC 40 06/06/2022   ALT 22 06/06/2022   AST 22 06/06/2022   NA 138 06/06/2022   K 4.6 06/06/2022   CL 103 06/06/2022   CREATININE 0.93 06/06/2022   BUN 13 06/06/2022   CO2 26 06/06/2022   TSH 0.63 06/06/2022   HGBA1C 5.4 06/06/2022  Last c xray 2022  neg   ASSESSMENT AND PLAN:  Discussed the following assessment and plan:    ICD-10-CM   1. Snoring  R06.83 US SOFT TISSUE HEAD & NECK (NON-THYROID)    Ambulatory referral to Pulmonology   nocturnal gasping noises and some  awakenings    2. Dyspnea, unspecified type  R06.00 US SOFT TISSUE HEAD & NECK (NON-THYROID)     Ambulatory referral to Pulmonology    3. Goiter diffuse  E04.0 US SOFT TISSUE HEAD & NECK (NON-THYROID)   hx of same  says neck feels full sometimes  may get neckimaging    4. 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 Neck swelling  R22.1 US SOFT TISSUE HEAD & NECK (NON-THYROID)   sensation  uncertain exam     Plan referral back to pulmonary for progressive nocturnal sx sleep disordered breathing  Plan airway evaluation    had had a goiter but no change and swallowing difficulty   May get ct [place] of neck   and ent if indicated  Work on avoid weight gain also  Counseled.   Expectant management and discussion of plan and treatment with opportunity to ask questions and all were answered. The patient agreed with the plan and demonstrated an understanding of the instructions.   Advised to call back or seek an in-person evaluation if worsening  or having  further concerns  in interim. Return in interim if getting worse.    Berniece Andreas, MD

## 2022-11-08 ENCOUNTER — Encounter: Payer: Self-pay | Admitting: Internal Medicine

## 2022-11-14 ENCOUNTER — Other Ambulatory Visit: Payer: Self-pay | Admitting: Internal Medicine

## 2022-11-14 ENCOUNTER — Ambulatory Visit
Admission: RE | Admit: 2022-11-14 | Discharge: 2022-11-14 | Disposition: A | Discharge status: Home / Self Care | Payer: BC Managed Care – PPO | Source: Ambulatory Visit | Attending: Internal Medicine | Admitting: Internal Medicine

## 2022-11-14 DIAGNOSIS — R0600 Dyspnea, unspecified: Secondary | ICD-10-CM

## 2022-11-14 DIAGNOSIS — R221 Localized swelling, mass and lump, neck: Secondary | ICD-10-CM

## 2022-11-14 DIAGNOSIS — G4730 Sleep apnea, unspecified: Secondary | ICD-10-CM

## 2022-11-14 DIAGNOSIS — E040 Nontoxic diffuse goiter: Secondary | ICD-10-CM

## 2022-11-14 DIAGNOSIS — R0683 Snoring: Secondary | ICD-10-CM

## 2022-11-16 NOTE — Progress Notes (Signed)
[place] of thyroid was pretty normal    Keep appt with pulmonary  and fu after  evaluated  t decide if other  actions needed

## 2022-12-13 ENCOUNTER — Encounter: Payer: Self-pay | Admitting: Nurse Practitioner

## 2022-12-13 ENCOUNTER — Ambulatory Visit: Payer: BC Managed Care – PPO | Admitting: Nurse Practitioner

## 2022-12-13 ENCOUNTER — Ambulatory Visit: Payer: BC Managed Care – PPO

## 2022-12-13 VITALS — BP 126/66 | HR 71 | Temp 98.3°F | Ht 64.0 in | Wt 188.0 lb

## 2022-12-13 DIAGNOSIS — H3093 Unspecified chorioretinal inflammation, bilateral: Secondary | ICD-10-CM

## 2022-12-13 DIAGNOSIS — R0602 Shortness of breath: Secondary | ICD-10-CM

## 2022-12-13 DIAGNOSIS — H5789 Other specified disorders of eye and adnexa: Secondary | ICD-10-CM | POA: Diagnosis not present

## 2022-12-13 DIAGNOSIS — R0609 Other forms of dyspnea: Secondary | ICD-10-CM | POA: Diagnosis not present

## 2022-12-13 DIAGNOSIS — J4541 Moderate persistent asthma with (acute) exacerbation: Secondary | ICD-10-CM | POA: Diagnosis not present

## 2022-12-13 LAB — POCT EXHALED NITRIC OXIDE: FeNO level (ppb): 26

## 2022-12-13 MED ORDER — PREDNISONE 20 MG PO TABS
40.0000 mg | ORAL_TABLET | Freq: Every day | ORAL | 0 refills | Status: AC
Start: 2022-12-13 — End: 2022-12-18

## 2022-12-13 MED ORDER — FLUTICASONE-SALMETEROL 100-50 MCG/ACT IN AEPB
1.0000 | INHALATION_SPRAY | Freq: Two times a day (BID) | RESPIRATORY_TRACT | 5 refills | Status: DC
Start: 2022-12-13 — End: 2023-04-20

## 2022-12-13 NOTE — Patient Instructions (Addendum)
Utilize Albuterol inhaler 2 puffs every 6 hours as needed for shortness of breath or wheezing Restart Advair 1 puff Twice daily. Brush tongue and rinse mouth afterwards. Use this regardless of your symptoms, even if your breathing is better  Prednisone 40 mg daily for 5 days. Take in AM with food. Let me know how this helps.  Pulmonary function testing  Chest x ray today.  CT chest ordered - someone will contact you for scheduling  Labs ordered - go to [facility] tomorrow to have these drawn  Follow up with your eye doctor and continue your medications as prescribed by them  Follow up in 6 weeks with Dr. Celine Mans. If symptoms do not improve or worsen, please contact office for sooner follow up or seek emergency care.

## 2022-12-14 ENCOUNTER — Ambulatory Visit: Payer: Self-pay

## 2022-12-14 DIAGNOSIS — M79675 Pain in left toe(s): Secondary | ICD-10-CM

## 2022-12-14 NOTE — Progress Notes (Signed)
Pt reports pain to the left great toe. Pt denies injury. Upon exam, minimal swelling noted. Pt given an ice pack, Tylenol and stressed the importance of drinking plenty of water.

## 2022-12-15 ENCOUNTER — Encounter: Payer: Self-pay | Admitting: Nurse Practitioner

## 2022-12-15 DIAGNOSIS — R0609 Other forms of dyspnea: Secondary | ICD-10-CM | POA: Insufficient documentation

## 2022-12-15 DIAGNOSIS — H3093 Unspecified chorioretinal inflammation, bilateral: Secondary | ICD-10-CM | POA: Insufficient documentation

## 2022-12-15 DIAGNOSIS — J45909 Unspecified asthma, uncomplicated: Secondary | ICD-10-CM | POA: Insufficient documentation

## 2022-12-15 NOTE — Assessment & Plan Note (Signed)
See above. Follow up with optho as scheduled.

## 2022-12-15 NOTE — Assessment & Plan Note (Addendum)
Initially felt symptoms were due to poorly controlled asthma, which is like a contributing factor; however, constellation of symptoms concerning for underlying autoimmune process, specifically sarcoid given age and ethnicity. Previous ACE was normal. She has not had any imaging. Will obtain further serologies, chest x ray and CT scan with contrast to evaluate lung parenchyma and for lymphadenopathy.

## 2022-12-15 NOTE — Assessment & Plan Note (Signed)
Poorly controlled with lack of maintenance therapy. Bronchitic cough. Elevated exhaled nitric oxide testing at 26 ppb. Will treat with prednisone burst and restart Advair. Insurance will not cover HFA. Can send prior auth if DPI does not control her well. She was re-educated on proper use/schedule. Advised this is to be used daily regardless of symptoms and not as needed. Continue PRN SABA. Action plan in place.  Patient Instructions  Utilize Albuterol inhaler 2 puffs every 6 hours as needed for shortness of breath or wheezing Restart Advair 1 puff Twice daily. Brush tongue and rinse mouth afterwards. Use this regardless of your symptoms, even if your breathing is better  Prednisone 40 mg daily for 5 days. Take in AM with food. Let me know how this helps.  Pulmonary function testing  Chest x ray today.  CT chest ordered - someone will contact you for scheduling  Labs ordered - go to [facility] tomorrow to have these drawn  Follow up with your eye doctor and continue your medications as prescribed by them  Follow up in 6 weeks with Dr. Celine Mans. If symptoms do not improve or worsen, please contact office for sooner follow up or seek emergency care.

## 2022-12-19 ENCOUNTER — Encounter: Payer: Self-pay | Admitting: Registered Nurse

## 2022-12-19 ENCOUNTER — Ambulatory Visit: Payer: Self-pay | Admitting: Registered Nurse

## 2022-12-19 VITALS — BP 122/81 | HR 66 | Resp 16 | Ht 66.0 in | Wt 182.0 lb

## 2022-12-19 DIAGNOSIS — M79672 Pain in left foot: Secondary | ICD-10-CM

## 2022-12-19 NOTE — Progress Notes (Signed)
Subjective:    Patient ID: Brandy Sanchez, female    DOB: 09-May-1995, 27 y.o.   MRN: [id number]  27y/o new patient last seen by clinic RN for toe/foot pain denied rash/swelling/history of gout/trauma.  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  Stated has had 15 lb weight gain since changing jobs at home scale 190lbs  Patient reported tried ice, tylenol and drinking water and pain resolved. Saw RN Chantal 12/14/22  Patient stated current shoes a couple months old does not rotate between 2 pairs.  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  HENT:  Negative for trouble swallowing and voice change.   Eyes:  Negative for photophobia and visual disturbance.  Respiratory:  Negative for cough, shortness of breath, wheezing and stridor.   Gastrointestinal:  Negative for diarrhea, nausea and vomiting.  Genitourinary:  Negative for difficulty urinating.  Musculoskeletal:  Positive for myalgias. Negative for arthralgias, back pain, gait problem, joint swelling, neck pain and neck stiffness.  Skin:  Negative for color change, pallor, rash and wound.  Neurological:  Negative for dizziness, tremors, seizures, syncope, facial asymmetry, speech difficulty, weakness, light-headedness, numbness and headaches.  Psychiatric/Behavioral:  Negative for agitation, confusion and sleep disturbance.        Objective:   Physical Exam Vitals and nursing note reviewed.  Constitutional:      General: She is awake. She is not in acute distress.    Appearance: Normal appearance. She is well-developed and well-groomed. She is not ill-appearing, toxic-appearing or diaphoretic.  HENT:     Head: Normocephalic and atraumatic.     Jaw: There is normal jaw occlusion.     Salivary  Glands: Right salivary gland is not diffusely enlarged. Left salivary gland is not diffusely enlarged.     Right Ear: Hearing and external ear normal.     Left Ear: Hearing and external ear normal.     Nose: Nose normal. No congestion or rhinorrhea.     Mouth/Throat:     Lips: Pink. No lesions.     Mouth: Mucous membranes are moist.     Pharynx: Oropharynx is clear.  Eyes:     General: Lids are normal. Vision grossly intact. Gaze aligned appropriately. No scleral icterus.       Right eye: No discharge.        Left eye: No discharge.     Extraocular Movements: Extraocular movements intact.     Conjunctiva/sclera: Conjunctivae normal.     Pupils: Pupils are equal, round, and reactive to light.  Neck:     Trachea: Trachea normal.  Cardiovascular:     Rate and Rhythm: Normal rate and regular rhythm.     Pulses: Normal pulses.          Popliteal pulses are 2+ on the right side and 2+ on the left side.       Dorsalis pedis pulses are 2+ on the right side and 2+ on the left side.  Pulmonary:     Effort: Pulmonary effort is normal.     Breath sounds: Normal breath sounds and air entry. No stridor or transmitted upper airway sounds. No wheezing.     Comments: Spoke full sentences without difficulty; no cough observed in exam room Abdominal:  Palpations: Abdomen is soft.  Musculoskeletal:        General: No swelling, tenderness, deformity or signs of injury. Normal range of motion.     Right hand: Normal strength. Normal capillary refill.     Left hand: Normal strength.     Cervical back: Normal range of motion and neck supple. No rigidity.     Right lower leg: No edema.     Left lower leg: No edema.     Right ankle: No swelling, deformity, ecchymosis or lacerations. No tenderness.     Left ankle: No swelling, deformity, ecchymosis or lacerations. No tenderness.     Right foot: Normal range of motion. Bunion present. No deformity, Charcot foot or foot drop.     Left foot: Normal range  of motion. Bunion present. No deformity, Charcot foot or foot drop.  Feet:     Right foot:     Skin integrity: Callus present. No ulcer, blister, skin breakdown, erythema, warmth, dry skin or fissure.     Toenail Condition: Right toenails are normal.     Left foot:     Skin integrity: Callus present. No ulcer, blister, skin breakdown, erythema, warmth, dry skin or fissure.     Toenail Condition: Left toenails are normal.     Comments: Bilateral 1st toe bunions Lymphadenopathy:     Head:     Right side of head: No submandibular or preauricular adenopathy.     Left side of head: No submandibular or preauricular adenopathy.     Cervical: No cervical adenopathy.     Right cervical: No superficial cervical adenopathy.    Left cervical: No superficial cervical adenopathy.  Skin:    General: Skin is warm and dry.     Capillary Refill: Capillary refill takes less than 2 seconds.     Coloration: Skin is not ashen, cyanotic, jaundiced, mottled, pale or sallow.     Findings: No abrasion, abscess, acne, bruising, burn, ecchymosis, erythema, signs of injury, laceration, lesion, petechiae, rash or wound. Rash is not crusting, macular, nodular, papular, purpuric, pustular, scaling, urticarial or vesicular.     Nails: There is no clubbing.     Comments: Bilateral feet/hands/arms/face assessed  Neurological:     General: No focal deficit present.     Mental Status: She is alert and oriented to person, place, and time. Mental status is at baseline.     GCS: GCS eye subscore is 4. GCS verbal subscore is 5. GCS motor subscore is 6.     Cranial Nerves: Cranial nerves 2-12 are intact. No cranial nerve deficit, dysarthria or facial asymmetry.     Motor: Motor function is intact. No weakness, tremor, atrophy, abnormal muscle tone or seizure activity.     Coordination: Coordination is intact. Coordination normal.     Gait: Gait is intact. Gait normal.     Comments: In/out of chair without difficulty; gait sure  and steady in clinic; bilateral hand grasp equal 5/5  Psychiatric:        Attention and Perception: Attention and perception normal.        Mood and Affect: Mood and affect normal.        Speech: Speech normal.        Behavior: Behavior normal. Behavior is cooperative.        Thought Content: Thought content normal.        Cognition and Memory: Cognition and memory normal.        Judgment: Judgment normal.  Normal exam today; shoe treads worn midsole    Assessment & Plan:  A-left foot pain  P-Discussed replace shoes after 40 hours standing weekly x 4 months.  Rotate 2 pairs of shoes for work to ensure drying out thoroughly between wearings and allow foam to re-expand.  Discussed with patient midsole treads are worn off.  Shoes in good working order but midfoot support getting worn.  Ensure arch support in shoes may use inserts from Dr Kathrynn Ducking.  [facility] has free foot assessment in [place] location can recommend sneakers to fit her foot type no purchase required.  Discussed plantar fasciitis, sesamoiditis and gout symptoms with patient.  May continue ice 15 minutes QID prn and tylenol 1000mg  po QID prn pain.  Exitcare handout on sesamoiditis.  Follow up re-evaluation if new symptoms occur.  Patient agreed with plan of care and had no further questions at this time.

## 2022-12-19 NOTE — Patient Instructions (Signed)
Foot Pain Many things can cause foot pain. Common causes include injuries to the foot. The injuries include sprains or broken bones, or injuries that affect the nerves in the feet. Other causes of foot pain include arthritis, blisters, and bunions. To know what causes your foot pain, your health care provider will take a detailed history of your symptoms. They will also do a physical exam as well as imaging tests, such as X-ray or MRI. Follow these instructions at home: Managing pain, stiffness, and swelling  If told, put ice on the painful area. Put ice in a plastic bag. Place a towel between your skin and the bag. Leave the ice on for 20 minutes, 2-3 times a day. If your skin turns bright red, remove the ice right away to prevent skin damage. The risk of damage is higher if you cannot feel pain, heat, or cold. Activity Do not stand or walk for long periods. Do stretches to relieve foot pain and stiffness as told by your provider. Do not lift anything that is heavier than 10 lb (4.5 kg), or the limit that you are told, until your provider says that it is safe. Lifting a lot of weight can put added pressure on your feet. Return to your normal activities as told by your provider. Ask your provider what activities are safe for you. Lifestyle Wear comfortable, supportive shoes that fit you well. Do not wear high heels. Keep your feet clean and dry. General instructions Take over-the-counter and prescription medicines only as told by your provider. Rub your foot gently. Pay attention to any changes in your symptoms. Let your provider know if symptoms become worse. Keep all follow-up visits. Your provider will want to monitor your progress. Contact a health care provider if: Your pain does not get better after a few days of treatment at home. Your pain gets worse. You cannot stand on your foot. Your foot or toes are swollen. Your foot is numb or tingling. Get help right away if: Your foot  or toes turn white or blue. You have warmth and redness along your foot. This information is not intended to replace advice given to you by your health care provider. Make sure you discuss any questions you have with your health care provider. Document Revised: 03/30/2022 Document Reviewed: 12/06/2021 Elsevier Patient Education  2024 [employer] Sesamoid Injury  A sesamoid injury happens when a sesamoid bone or a surrounding tendon gets damaged during activity. A sesamoid bone is a bone that is connected to a tendon or a muscle, but not to a joint. There are sesamoid bones in your hands, knees, and feet. Your kneecap is an example of a sesamoid bone. Sesamoid injuries may include irritation, dislocation, or a break (fracture) in a sesamoid bone. The most common sesamoid injuries affect the sesamoid bones under the big toe. These bones help you move forward during weight-bearing activities. What are the causes? This condition is caused by damage to a sesamoid bone or a surrounding tendon. What increases the risk? This condition is more likely to develop in people who: Dance and Archivist. Run. Play sports. Are active on artificial turf. Wear high heels. Have an injury to the thumb. What are the signs or symptoms? Symptoms of this condition include: Pain in the affected hand, knee, or foot. Pain when you try to straighten (extend) or bend (flex) the affected toe, knee, or finger. A popping sound that happens at the time of injury. Swelling. Bruising. How is this diagnosed?  This condition is diagnosed with: A physical exam. Observation of your movement while you walk. An X-ray. Bone scans. How is this treated? Treatment for this condition depends on the location, type, and severity of the injury. Treatment may include: Resting the affected area and avoiding activities that are causing injury. Applying ice to the affected area. Taking over-the-counter pain medicine. Placing  a cushioned pad in the shoe of the affected foot. Taping the affected finger or toe to prevent movement. Getting steroid injections. Wearing a cast, brace, or orthotic shoe. Doing physical therapy. Surgery may be needed if other treatments do not work. Follow these instructions at home: If you have a cast: Do not put pressure on any part of the cast until it is fully hardened. This may take several hours. Do not stick anything inside the cast to scratch your skin. Doing that increases your risk of infection. Check the skin around it every day. Tell your health care provider about any concerns. You may put lotion on dry skin around the edges of the cast. Do not put lotion on the skin underneath it. Keep it clean and dry. If you have a brace or orthotic shoe: Wear it as told by your health care provider. Remove it only as told by your health care provider. Loosen it if your fingers or toes tingle, become numb, or turn cold and blue. Keep it clean and dry. Bathing Do not take baths, swim, or use a hot tub until your health care provider approves. Ask your health care provider if you may take showers. You may only be allowed to take sponge baths. If the cast, brace, or orthotic shoe is not waterproof: Do not let it get wet. Cover it with a watertight covering when you take a bath or shower. Managing pain, stiffness, and swelling  If directed, put ice on the injured area. To do this: If you have a removable brace or orthotic shoe, remove it as told by your health care provider. Put ice in a plastic bag. Place a towel between your skin and the bag or between your cast and the bag. Leave the ice on for 20 minutes, 2-3 times a day. Remove the ice if your skin turns bright red. This is very important. If you cannot feel pain, heat, or cold, you have a greater risk of damage to the area. Move your fingers or toes often to reduce stiffness and swelling. Raise (elevate) the injured area above the  level of your heart while you are sitting or lying down. Driving Ask your health care provider if the medicine prescribed to you requires you to avoid driving or using heavy machinery. Ask your health care provider when it is safe to drive if you have a cast, brace, or orthotic shoe on a foot that you use for driving. Activity Do not use the injured limb to support your body weight until your health care provider says that you can. Use crutches as told by your health care provider. Do exercises as told by your health care provider or physical therapist. Return to your normal activities as told by your health care provider. Ask your health care provider what activities are safe for you. General instructions Take over-the-counter and prescription medicines only as told by your health care provider. Do not use any products that contain nicotine or tobacco before the procedure. These products include cigarettes, chewing tobacco, and vaping devices, such as e-cigarettes. If you need help quitting, ask your health care provider.  Keep all follow-up visits. This is important. Contact a health care provider if: You have pain and swelling that continues, even with treatment. Your pain and swelling returns after you get back to your normal activities. You cannot put pressure on your foot. Get help right away if: You lose sensation in the affected area. Your fingers or toes turn cold and blue. Summary A sesamoid injury happens when a sesamoid bone or a surrounding tendon gets damaged during activity. Symptoms of this condition include pain in the affected area, a popping sound at the time of injury, swelling, and bruising. Treatment for this condition depends on the location, type, and severity of the injury. This information is not intended to replace advice given to you by your health care provider. Make sure you discuss any questions you have with your health care provider. Document Revised:  11/08/2020 Document Reviewed: 11/08/2020 Elsevier Patient Education  2024 [employer].

## 2022-12-21 ENCOUNTER — Other Ambulatory Visit (INDEPENDENT_AMBULATORY_CARE_PROVIDER_SITE_OTHER): Payer: BC Managed Care – PPO

## 2022-12-21 ENCOUNTER — Other Ambulatory Visit: Payer: Self-pay

## 2022-12-21 DIAGNOSIS — R0602 Shortness of breath: Secondary | ICD-10-CM | POA: Diagnosis not present

## 2022-12-21 DIAGNOSIS — H5789 Other specified disorders of eye and adnexa: Secondary | ICD-10-CM

## 2022-12-22 LAB — SEDIMENTATION RATE: Sed Rate: 6 mm/h (ref 0–20)

## 2022-12-25 ENCOUNTER — Other Ambulatory Visit: Payer: Self-pay | Admitting: Nurse Practitioner

## 2022-12-25 DIAGNOSIS — M3500 Sicca syndrome, unspecified: Secondary | ICD-10-CM

## 2022-12-25 DIAGNOSIS — R768 Other specified abnormal immunological findings in serum: Secondary | ICD-10-CM

## 2022-12-25 DIAGNOSIS — H3093 Unspecified chorioretinal inflammation, bilateral: Secondary | ICD-10-CM

## 2022-12-25 NOTE — Progress Notes (Signed)
Sjogren's and ANA positive. I have placed a referral to rheumatology. Follow up with [place] as scheduled. Thanks.

## 2022-12-27 LAB — ANTI-SCLERODERMA ANTIBODY: Scleroderma (Scl-70) (ENA) Antibody, IgG: <1 AI

## 2022-12-27 LAB — ANA: Anti Nuclear Antibody (ANA): POSITIVE — AB

## 2022-12-27 LAB — SJOGRENS SYNDROME-A EXTRACTABLE NUCLEAR ANTIBODY: SSA (Ro) (ENA) Antibody, IgG: >8 AI — AB

## 2022-12-27 LAB — RHEUMATOID FACTOR: Rheumatoid fact SerPl-aCnc: <10 [IU]/mL (ref <14)

## 2022-12-27 LAB — ANTI-NUCLEAR AB-TITER (ANA TITER): ANA Titer 1: 1:640 {titer} — ABNORMAL HIGH

## 2022-12-27 LAB — SJOGRENS SYNDROME-B EXTRACTABLE NUCLEAR ANTIBODY: SSB (La) (ENA) Antibody, IgG: <1 AI

## 2022-12-27 LAB — ANTI-DNASE B ANTIBODY: Anti-DNAse-B: 262 U/mL (ref <301)

## 2023-01-01 ENCOUNTER — Ambulatory Visit
Admission: RE | Admit: 2023-01-01 | Discharge: 2023-01-01 | Disposition: A | Discharge status: Home / Self Care | Payer: BC Managed Care – PPO | Source: Ambulatory Visit | Attending: Nurse Practitioner | Admitting: Nurse Practitioner

## 2023-01-01 DIAGNOSIS — R0602 Shortness of breath: Secondary | ICD-10-CM

## 2023-01-01 MED ORDER — IOPAMIDOL (ISOVUE-300) INJECTION 61%
500.0000 mL | Freq: Once | INTRAVENOUS | Status: AC | PRN
  Administered 2023-01-01: 75 mL via INTRAVENOUS

## 2023-01-04 ENCOUNTER — Telehealth: Payer: Self-pay | Admitting: Registered Nurse

## 2023-01-04 ENCOUNTER — Encounter: Payer: Self-pay | Admitting: Registered Nurse

## 2023-01-04 DIAGNOSIS — H3093 Unspecified chorioretinal inflammation, bilateral: Secondary | ICD-10-CM

## 2023-01-04 NOTE — Telephone Encounter (Signed)
Patient with questions regarding positive sjogren's ana and ana test.  Has left messages for pulmonology provider but no reply.  Reviewed my chart and epic with patient and rheumatology referral entered by pulmonology provider for patient.  Discussed with patient if not contacted next week to schedule rheumatology appt to contact her pulmonology office again.  Discussed ana nonspecific for type of rheumatology disease and sometimes there are things that cause false ana test positive.  Sjogren's ana test B more specific but to discuss results with rheumatology provider at appt and what positive results means for her regarding if diagnosis has been determined for her symptoms.  Patient agreed with plan of care and had no further questions at this time.

## 2023-01-12 ENCOUNTER — Other Ambulatory Visit: Payer: Self-pay | Admitting: Family

## 2023-01-12 ENCOUNTER — Encounter: Payer: Self-pay | Admitting: Internal Medicine

## 2023-01-12 DIAGNOSIS — H3090 Unspecified chorioretinal inflammation, unspecified eye: Secondary | ICD-10-CM

## 2023-01-12 DIAGNOSIS — R635 Abnormal weight gain: Secondary | ICD-10-CM

## 2023-01-12 DIAGNOSIS — Z79899 Other long term (current) drug therapy: Secondary | ICD-10-CM

## 2023-01-12 DIAGNOSIS — Z3041 Encounter for surveillance of contraceptive pills: Secondary | ICD-10-CM

## 2023-01-12 DIAGNOSIS — E040 Nontoxic diffuse goiter: Secondary | ICD-10-CM

## 2023-01-12 DIAGNOSIS — Z0000 Encounter for general adult medical examination without abnormal findings: Secondary | ICD-10-CM

## 2023-01-12 MED ORDER — LEVONORGESTREL-ETHINYL ESTRAD 0.1-20 MG-MCG PO TABS: 1.0000 | ORAL_TABLET | Freq: Every day | ORAL | 3 refills | Status: DC

## 2023-01-23 ENCOUNTER — Telehealth: Payer: Self-pay | Admitting: Registered Nurse

## 2023-01-25 ENCOUNTER — Encounter: Payer: Self-pay | Admitting: Registered Nurse

## 2023-01-25 ENCOUNTER — Ambulatory Visit: Payer: Self-pay | Admitting: Registered Nurse

## 2023-01-25 VITALS — BP 151/94 | HR 78 | Temp 99.2°F

## 2023-01-25 DIAGNOSIS — R1110 Vomiting, unspecified: Secondary | ICD-10-CM

## 2023-01-25 LAB — GLUCOSE, POCT (MANUAL RESULT ENTRY): POC Glucose: 87 mg/dL (ref 70–99)

## 2023-01-25 MED ORDER — ONDANSETRON HCL 4 MG PO TABS
4.0000 mg | ORAL_TABLET | Freq: Two times a day (BID) | ORAL | 0 refills | Status: AC | PRN
Start: 2023-01-25 — End: 2023-01-28

## 2023-01-25 MED ORDER — ONDANSETRON HCL 4 MG PO TABS: 4.0000 mg | ORAL_TABLET | Freq: Two times a day (BID) | ORAL | 0 refills | Status: DC | PRN

## 2023-01-25 NOTE — Progress Notes (Signed)
Telephone message left for pharmacist with prescription information at [phone]

## 2023-01-25 NOTE — Patient Instructions (Signed)
Nausea and Vomiting, Adult Nausea is the feeling that you have an upset stomach or that you are about to vomit. As nausea gets worse, it can lead to vomiting. Vomiting is when stomach contents forcefully come out of your mouth as a result of nausea. Vomiting can make you feel weak and cause you to become dehydrated. Dehydration can make you feel tired and thirsty, cause you to have a dry mouth, and decrease how often you urinate. Older adults and people with other diseases or a weak disease-fighting system (immune system) are at higher risk for dehydration. It is important to treat your nausea and vomiting as told by your health care provider. Follow these instructions at home: Watch your symptoms for any changes. Tell your health care provider about them. Eating and drinking     Take an oral rehydration solution (ORS). This is a drink that is sold at pharmacies and retail stores. Drink clear fluids slowly and in small amounts as you are able. Clear fluids include water, ice chips, low-calorie sports drinks, and fruit juice that has water added (diluted fruit juice). Eat bland, easy-to-digest foods in small amounts as you are able. These foods include bananas, applesauce, rice, lean meats, toast, and crackers. Avoid fluids that contain a lot of sugar or caffeine, such as energy drinks, sports drinks, and soda. Avoid alcohol. Avoid spicy or fatty foods. General instructions Take over-the-counter and prescription medicines only as told by your health care provider. Drink enough fluid to keep your urine pale yellow. Wash your hands often using soap and water for at least 20 seconds. If soap and water are not available, use hand sanitizer. Make sure that everyone in your household washes their hands well and often. Rest at home while you recover. Watch your condition for any changes. Take slow and deep breaths when you feel nauseous. Keep all follow-up visits. This is important. Contact a health  care provider if: Your symptoms get worse. You have new symptoms. You have a fever. You cannot drink fluids without vomiting. Your nausea does not go away after 2 days. You feel light-headed or dizzy. You have a headache. You have muscle cramps. You have a rash. You have pain while urinating. Get help right away if: You have pain in your chest, neck, arm, or jaw. You feel extremely weak or you faint. You have persistent vomiting. You have vomit that is bright red or looks like black coffee grounds. You have bloody or black stools (feces) or stools that look like tar. You have a severe headache, a stiff neck, or both. You have severe pain, cramping, or bloating in your abdomen. You have difficulty breathing, or you are breathing very quickly. Your heart is beating very quickly. Your skin feels cold and clammy. You feel confused. You have signs of dehydration, such as: Dark urine, very little urine, or no urine. Cracked lips. Dry mouth. Sunken eyes. Sleepiness. Weakness. These symptoms may be an emergency. Get help right away. Call 911. Do not wait to see if the symptoms will go away. Do not drive yourself to the hospital. Summary Nausea is the feeling that you have an upset stomach or that you are about to vomit. As nausea gets worse, it can lead to vomiting. Vomiting can make you feel weak and cause you to become dehydrated. Follow instructions from your health care provider about eating and drinking to prevent dehydration. Take over-the-counter and prescription medicines only as told by your health care provider. Contact your health care   provider if your symptoms get worse, or you have new symptoms. Keep all follow-up visits. This is important. This information is not intended to replace advice given to you by your health care provider. Make sure you discuss any questions you have with your health care provider. Document Revised: 09/10/2020 Document Reviewed:  09/10/2020 Elsevier Patient Education  2024 Elsevier Inc.  

## 2023-01-25 NOTE — Progress Notes (Signed)
Pt reports to the clinic for 1 episode of emesis. Pt reports that the emesis was undigested food. Denies nausea, abdominal pain or diarrhea at this time. Pt states that she had chest tightness this AM, but has since resolved. Denies chest pain at this time.

## 2023-01-25 NOTE — Progress Notes (Addendum)
Subjective:    Patient ID: Brandy Sanchez, female    DOB: 11/16/1995, 27 y.o.   MRN: [id number]  27y/o established patient here for evaluation after vomiting in work section.  Patient denied feeling ill when waking up today.  Yesterday had some saliva issues, TMJ pain and swelling cheeks stayed home from work.  Patient showed me pictures on her phone of facial swelling yesterday ?preauricular/parotid 1-2+/4 left  Contacted rheumatology and was told still waiting for insurance approval before she can book appt.  Patient is to call back tomorrow to schedule per office staff.  Patient stated ate 2 slim jims and water for breakfast and mostly water thrown up prior to arrival in clinic.  Feeling better.  Denied fever/chills/URI/nausea/diarrhea/abdomen pain.  Facial swelling resolved overnight.  Denied known sick contacts.      Review of Systems  Constitutional:  Negative for activity change, appetite change, chills, diaphoresis and fever.  HENT:  Positive for facial swelling. Negative for congestion, dental problem, hearing loss, mouth sores, nosebleeds, sinus pressure, sinus pain, sneezing, sore throat, tinnitus, trouble swallowing and voice change.   Eyes:  Negative for photophobia and visual disturbance.  Respiratory:  Positive for chest tightness. Negative for cough, choking, shortness of breath, wheezing and stridor.   Cardiovascular:  Negative for chest pain and palpitations.  Gastrointestinal:  Positive for vomiting. Negative for abdominal distention, abdominal pain, constipation, diarrhea and nausea.  Genitourinary:  Negative for difficulty urinating.  Musculoskeletal:  Negative for gait problem, neck pain and neck stiffness.  Skin:  Negative for color change, pallor, rash and wound.  Neurological:  Negative for dizziness, tremors, seizures, syncope, facial asymmetry, speech difficulty, weakness, light-headedness, numbness and headaches.  Hematological:  Does not bruise/bleed easily.   Psychiatric/Behavioral:  Negative for agitation, confusion and sleep disturbance.        Objective:   Physical Exam Vitals and nursing note reviewed.  Constitutional:      General: She is awake. She is not in acute distress.    Appearance: Normal appearance. She is well-developed, well-groomed and overweight. She is not ill-appearing, toxic-appearing or diaphoretic.  HENT:     Head: Normocephalic and atraumatic.     Jaw: There is normal jaw occlusion. No trismus, tenderness, swelling, pain on movement or malocclusion.     Salivary Glands: Right salivary gland is not diffusely enlarged or tender. Left salivary gland is not diffusely enlarged or tender.     Right Ear: Hearing, ear canal and external ear normal. No decreased hearing noted. No laceration, drainage, swelling or tenderness. A middle ear effusion is present. There is no impacted cerumen. No foreign body. No mastoid tenderness. No PE tube. No hemotympanum. Tympanic membrane is not injected, scarred, perforated, erythematous, retracted or bulging.     Left Ear: Hearing, ear canal and external ear normal. No decreased hearing noted. No laceration, drainage, swelling or tenderness. A middle ear effusion is present. There is no impacted cerumen. No foreign body. No mastoid tenderness. No PE tube. No hemotympanum. Tympanic membrane is not injected, scarred, perforated, erythematous, retracted or bulging.     Ears:     Comments: Bilateral TMs intact air fluid level clear no debris noted in auditory canals    Nose: Nose normal. No congestion or rhinorrhea.     Right Turbinates: Not enlarged, swollen or pale.     Left Turbinates: Not enlarged, swollen or pale.     Right Sinus: No maxillary sinus tenderness or frontal sinus tenderness.  Left Sinus: No maxillary sinus tenderness or frontal sinus tenderness.     Mouth/Throat:     Lips: Pink. No lesions.     Mouth: Mucous membranes are moist. No oral lesions or angioedema.     Dentition:  No gum lesions.     Tongue: No lesions. Tongue does not deviate from midline.     Palate: No mass and lesions.     Pharynx: Oropharynx is clear. Uvula midline. Postnasal drip present. No pharyngeal swelling, oropharyngeal exudate, posterior oropharyngeal erythema or uvula swelling.     Tonsils: No tonsillar exudate or tonsillar abscesses.     Comments: Cobblestoning posterior pharynx; bilateral allergic shiners Eyes:     General: Lids are normal. Vision grossly intact. Gaze aligned appropriately. Allergic shiner present. No scleral icterus.       Right eye: No discharge.        Left eye: No discharge.     Extraocular Movements: Extraocular movements intact.     Conjunctiva/sclera: Conjunctivae normal.     Pupils: Pupils are equal, round, and reactive to light.  Neck:     Trachea: Trachea and phonation normal. No abnormal tracheal secretions.  Cardiovascular:     Rate and Rhythm: Normal rate and regular rhythm.     Pulses:          Radial pulses are 2+ on the right side and 2+ on the left side.     Heart sounds: Normal heart sounds, S1 normal and S2 normal.  Pulmonary:     Effort: Pulmonary effort is normal.     Breath sounds: Normal breath sounds and air entry. No stridor, decreased air movement or transmitted upper airway sounds. No decreased breath sounds, wheezing or rhonchi.     Comments: Spoke full sentences without difficulty; no cough observed in exam room Abdominal:     General: Abdomen is flat. Bowel sounds are decreased. There is no distension or abdominal bruit. There are no signs of injury.     Palpations: There is no shifting dullness, fluid wave, hepatomegaly, splenomegaly, mass or pulsatile mass.     Tenderness: There is no abdominal tenderness. There is no right CVA tenderness, left CVA tenderness, guarding or rebound. Negative signs include Murphy's sign.     Comments: Dull to percussion x 4 quads; hypoactive bowel sounds x 4 quads; standing to sitting to supine and  reversed quickly without assist on exam table abdomen snd  Musculoskeletal:        General: Normal range of motion.     Right hand: Normal strength. Normal capillary refill.     Left hand: Normal strength. Normal capillary refill.     Cervical back: Normal range of motion and neck supple. No swelling, edema, deformity, erythema, signs of trauma, lacerations, rigidity, spasms, torticollis, tenderness or crepitus. No pain with movement or muscular tenderness. Normal range of motion.     Thoracic back: No swelling, edema, deformity, signs of trauma, lacerations, spasms, tenderness or bony tenderness. Normal range of motion.  Lymphadenopathy:     Head:     Right side of head: No submental, submandibular, tonsillar, preauricular, posterior auricular or occipital adenopathy.     Left side of head: No submental, submandibular, tonsillar, preauricular, posterior auricular or occipital adenopathy.     Cervical: No cervical adenopathy.     Right cervical: No superficial, deep or posterior cervical adenopathy.    Left cervical: No superficial, deep or posterior cervical adenopathy.  Skin:    General: Skin is warm and  dry.     Capillary Refill: Capillary refill takes less than 2 seconds.     Coloration: Skin is not ashen, cyanotic, jaundiced, mottled, pale or sallow.     Findings: No abrasion, abscess, acne, bruising, burn, ecchymosis, erythema, signs of injury, laceration, lesion, petechiae, rash or wound.     Nails: There is no clubbing.  Neurological:     General: No focal deficit present.     Mental Status: She is alert and oriented to person, place, and time. Mental status is at baseline.     GCS: GCS eye subscore is 4. GCS verbal subscore is 5. GCS motor subscore is 6.     Cranial Nerves: Cranial nerves 2-12 are intact. No cranial nerve deficit, dysarthria or facial asymmetry.     Sensory: Sensation is intact.     Motor: Motor function is intact. No weakness, tremor, atrophy, abnormal muscle tone  or seizure activity.     Coordination: Coordination is intact. Coordination normal.     Gait: Gait is intact. Gait normal.     Comments: In/out of chair and on/off exam table without difficulty; gait sure and steady in clinic; bilateral hand grasp equal 5/5  Psychiatric:        Attention and Perception: Attention and perception normal.        Mood and Affect: Mood and affect normal.        Speech: Speech normal.        Behavior: Behavior normal. Behavior is cooperative.        Thought Content: Thought content normal.        Cognition and Memory: Cognition and memory normal.        Judgment: Judgment normal.    Home covid test results negative Received error message Rx transmission contacted patient pharmacy via telephone left message for Rx order provider line. Patient notified glucose level normal for nonfasting.Patient verbalized understanding information and had no further questions at that time.     Assessment & Plan:  A-vomiting in adult  P-poct glucose now and given 1 free [place] govt home covid test to complete prior to release to workcenter.  Employer communicable disease policy stay home until n/v/d resolved x 24 hours.  Emesis approximately 1030 today does not work weekends therefore expected RTW Monday 14 Nov.  discussed most likely viral gastroenteritis as circulating in community.  Schedule evaluation with rheumatology referral by [facility].  Patient with positive ANA.  Discussed with patient saliva/facial swelling could be related to rheumatological issue e.g. sjogrens.  Discussed I have not diagnosed her with sjogrens disease today.  Electronic Rx zofran 4mg  po BID prn nausea/vomiting #6 RF0 to pharmacy of choice.   I have recommended clear fluids and bland diet.  Avoid dairy/spicy, fried and large portions of meat while having nausea.  If vomiting hold po intake x 1 hour.  Then sips clear fluids like broths, ginger ale, power ade, gatorade, pedialyte may advance to soft/bland if no  vomiting x 24 hours and appetite returned otherwise hydration main focus.     Return to the clinic if symptoms persist or worsen; I have alerted the patient to call if high fever, dehydration, marked weakness, fainting, increased abdominal pain, blood in stool or vomit (red or black).   Exitcare handout on nausea/vomiting/viral gastroenteritis Discussed with patient excused absence from work 48 hours re-evaluation this weekend via telephone.  Email notification to supervisor and HR.  Patient verbalized agreement and understanding of treatment plan and had no further questions  at this time. Marland Kitchen

## 2023-01-26 NOTE — Telephone Encounter (Signed)
Can you please check on her rheum referral?

## 2023-01-29 ENCOUNTER — Ambulatory Visit: Payer: BC Managed Care – PPO | Admitting: Internal Medicine

## 2023-01-29 DIAGNOSIS — J4541 Moderate persistent asthma with (acute) exacerbation: Secondary | ICD-10-CM

## 2023-01-29 DIAGNOSIS — R0602 Shortness of breath: Secondary | ICD-10-CM

## 2023-01-29 LAB — PULMONARY FUNCTION TEST
DL/VA % pred: 121 %
DL/VA: 5.58 ml/min/mmHg/L
DLCO cor % pred: 99 %
DLCO cor: 23.83 ml/min/mmHg
DLCO unc % pred: 99 %
DLCO unc: 23.83 ml/min/mmHg
FEF 25-75 Post: 2.97 L/s
FEF 25-75 Pre: 1.98 L/s
FEF2575-%Change-Post: 49 %
FEF2575-%Pred-Post: 79 %
FEF2575-%Pred-Pre: 53 %
FEV1-%Change-Post: 21 %
FEV1-%Pred-Post: 86 %
FEV1-%Pred-Pre: 71 %
FEV1-Post: 2.99 L
FEV1-Pre: 2.47 L
FEV1FVC-%Change-Post: 22 %
FEV1FVC-%Pred-Pre: 81 %
FEV6-%Change-Post: -1 %
FEV6-%Pred-Post: 87 %
FEV6-%Pred-Pre: 88 %
FEV6-Post: 3.5 L
FEV6-Pre: 3.54 L
FEV6FVC-%Change-Post: 0 %
FEV6FVC-%Pred-Post: 100 %
FEV6FVC-%Pred-Pre: 100 %
FVC-%Change-Post: -1 %
FVC-%Pred-Post: 86 %
FVC-%Pred-Pre: 88 %
FVC-Post: 3.5 L
FVC-Pre: 3.55 L
Post FEV1/FVC ratio: 85 %
Post FEV6/FVC ratio: 100 %
Pre FEV1/FVC ratio: 69 %
Pre FEV6/FVC Ratio: 100 %
RV % pred: 84 %
RV: 1.17 L
TLC % pred: 86 %
TLC: 4.62 L

## 2023-01-29 NOTE — Patient Instructions (Signed)
Full PFT performed today. °

## 2023-01-29 NOTE — Progress Notes (Signed)
Full PFT performed today. °

## 2023-01-31 NOTE — Telephone Encounter (Signed)
Patient seen in clinic 01/25/23 see office note

## 2023-02-08 ENCOUNTER — Encounter: Payer: Self-pay | Admitting: Internal Medicine

## 2023-02-08 ENCOUNTER — Ambulatory Visit (INDEPENDENT_AMBULATORY_CARE_PROVIDER_SITE_OTHER): Payer: BC Managed Care – PPO | Admitting: Internal Medicine

## 2023-02-08 VITALS — BP 108/68 | HR 84 | Temp 98.7°F | Ht 64.0 in | Wt 186.4 lb

## 2023-02-08 DIAGNOSIS — J4540 Moderate persistent asthma, uncomplicated: Secondary | ICD-10-CM | POA: Diagnosis not present

## 2023-02-08 DIAGNOSIS — J309 Allergic rhinitis, unspecified: Secondary | ICD-10-CM

## 2023-02-08 MED ORDER — MONTELUKAST SODIUM 10 MG PO TABS: 10.0000 mg | ORAL_TABLET | Freq: Every day | ORAL | 11 refills | Status: DC

## 2023-02-08 NOTE — Progress Notes (Signed)
Brandy Sanchez    [id number]    [birth year]  Primary Care Physician:Panosh, Neta Mends, MD Date of Appointment: 02/08/2023 Established Patient Visit  Chief complaint:   Chief Complaint  Patient presents with   Follow-up    Review PFT     HPI: Brandy Sanchez is a 27 y.o. woman with moderate persistent asthma  Interval Updates: Here for asthma follow up after pfts.   Using albuterol less than once/week.   She says her parents are concerned about her sleeping. She wakes up coughing or the feeling of something stuck in her throat. She does have a persistent cough during the day. She denies reflux. She is having nasal drainage. Post nasal drip is waking her up at night.   Current Regimen: advair 230 2 puffs twice daily prn albuterol  Asthma Triggers: exertion Exacerbations in the last year: none History of hospitalization or intubation: none Allergy Testing: never had GERD: none Allergic Rhinitis: yes using flonase.  ACT:  Asthma Control Test ACT Total Score  04/26/2021  1:56 PM 19   FeNO: 19 ppb  I have reviewed the patient's family social and past medical history and updated as appropriate.   Past Medical History:  Diagnosis Date   Allergic rhinitis    Anxiety    Viral thyroiditis 09/09/2013    Past Surgical History:  Procedure Laterality Date   CYST EXCISION Right 05/30/2018   Procedure: CYST REMOVAL RIGHT WRIST;  Surgeon: [name], MD;  Location: [facility];  Service: Orthopedics;  Laterality: Right;    Family History  Problem Relation Age of Onset   Thyroid disease Mother        removed due to goiter and trouble swallowing   Hypertension Mother    Asthma Sister    Thyroid disease Maternal Grandmother    Diabetes Paternal Grandmother    Thyroid disease Maternal Aunt        removed- enlarged    Social History   Occupational History   Not on file  Tobacco Use   Smoking status: Never   Smokeless tobacco: Never  Vaping Use    Vaping status: Never Used  Substance and Sexual Activity   Alcohol use: No   Drug use: No   Sexual activity: Yes    Birth control/protection: Pill     Physical Exam: Blood pressure 108/68, pulse 84, temperature 98.7 F (37.1 C), temperature source Oral, height 5\' 4"  (1.626 m), weight 186 lb 6.4 oz (84.6 kg), SpO2 99%.  Gen:      No acute distress Lungs:    ctab no wheezes CV:         RRR no mrg   Data Reviewed: Imaging: I have personally reviewed the chest xray 11/2020 shows no acute process  PFTs:     Latest Ref Rng & Units 01/29/2023    4:00 PM  PFT Results  FVC-Pre L 3.55   FVC-Predicted Pre % 88   FVC-Post L 3.50   FVC-Predicted Post % 86   Pre FEV1/FVC % % 69   Post FEV1/FCV % % 85   FEV1-Pre L 2.47   FEV1-Predicted Pre % 71   FEV1-Post L 2.99   DLCO uncorrected ml/min/mmHg 23.83   DLCO UNC% % 99   DLCO corrected ml/min/mmHg 23.83   DLCO COR %Predicted % 99   DLVA Predicted % 121   TLC L 4.62   TLC % Predicted % 86   RV %  Predicted % 84    I have personally reviewed the patient's PFTs and spirometry 01/29/2023 shows no airflow limitation.   Labs: Lab Results  Component Value Date   WBC 4.5 06/06/2022   HGB 13.9 06/06/2022   HCT 41.2 06/06/2022   MCV 89.1 06/06/2022   PLT 489.0 (H) 06/06/2022   Lab Results  Component Value Date   NA 138 06/06/2022   K 4.6 06/06/2022   CL 103 06/06/2022   CO2 26 06/06/2022     Immunization status: Immunization History  Administered Date(s) Administered   DTP 07/16/1997, 09/03/1997, 11/11/1997, 08/02/1998, 08/26/2002   HIB (PRP-OMP) 07/16/1997, 09/03/1997, 11/11/1997, 05/03/1998   HPV 9-valent 07/23/2015   HPV Quadrivalent 08/12/2010, 10/13/2010   Hepatitis A, Adult 07/23/2015   Hepatitis B 1997[birth year]04[birth year], 09/03/1997, 11/11/1997   Influenza Inj Mdck Quad Pf 04/09/2020, 02/06/2021   Influenza Split 01/18/2011, 01/15/2012   Influenza Whole 01/29/2009, 02/09/2010   Influenza,inj,Quad PF,6+ Mos 01/21/2013,  12/29/2014, 12/04/2016   MMR 05/03/1998, 08/26/2002   Meningococcal Conjugate 08/12/2010   Meningococcal polysaccharide vaccine (MPSV4) 08/26/2008   Moderna Sars-Covid-2 Vaccination 05/24/2019, 06/21/2019, 04/09/2020   OPV 07/16/1997, 09/03/1997, 05/03/1998, 08/26/2002   PPD Test 08/25/2013, 08/09/2016   Pfizer Covid-19 Vaccine Bivalent Booster 80yrs & up 02/06/2021   Pneumococcal Conjugate-13 12/27/1998, 02/08/1999   Td 08/26/2008   Tdap 10/02/2018   Varicella 05/03/1998, 08/26/2008    External Records Personally Reviewed: PCP  Assessment:  Moderate persistent asthma, improved controlled Peripheral eosinophilia Chronic allergic rhinitis, not well controlled  Plan/Recommendations: Breathing testing is consistent with asthma.   Continue the advair 2 puffs twice daily, gargle after use.   Take the albuterol rescue inhaler every 4 to 6 hours as needed for wheezing or shortness of breath. You can also take it 15 minutes before exercise or exertional activity. Side effects include heart racing or pounding, jitters or anxiety. If you have a history of an irregular heart rhythm, it can make this worse. Can also give some patients a hard time sleeping.  Blood work today for allergy testing.   Continue the flonase daily. Add singulair pill for allergies and asthma that might be bothering you at night.    Return to Care: Return in about 6 months (around 08/08/2023).   Durel Salts, MD Pulmonary and Critical Care Medicine [facility] Office:[phone]

## 2023-02-08 NOTE — Patient Instructions (Signed)
It was a pleasure to see you today!  Please schedule follow up scheduled with myself in 6 months.  If my schedule is not open yet, we will contact you with a reminder closer to that time. Please call [phone] if you haven't heard from [place] a month before, and always call us sooner if issues or concerns arise. You can also send [place] a message through MyChart, but but aware that this is not to be used for urgent issues and it may take up to 5-7 days to receive a reply. Please be aware that you will likely be able to view your results before I have a chance to respond to them. Please give [place] 5 business days to respond to any non-urgent results.   Breathing testing is consistent with asthma.   Continue the advair 2 puffs twice daily, gargle after use.   Take the albuterol rescue inhaler every 4 to 6 hours as needed for wheezing or shortness of breath. You can also take it 15 minutes before exercise or exertional activity. Side effects include heart racing or pounding, jitters or anxiety. If you have a history of an irregular heart rhythm, it can make this worse. Can also give some patients a hard time sleeping.  Blood work today for allergy testing.   Continue the flonase daily. Add singulair pill for allergies and asthma that might be bothering you at night.

## 2023-02-09 NOTE — Telephone Encounter (Signed)
Patient submitted resignation 05 Feb 2023 no longer eligible for care at [facility] clinic per HR.

## 2023-02-12 LAB — RESPIRATORY ALLERGY PROFILE REGION II ~~LOC~~
Allergen, A. alternata, m6: 0.23 kU/L — ABNORMAL HIGH
Allergen, Cedar tree, t12: <0.1 kU/L
Allergen, Comm Silver Birch, t9: <0.1 kU/L
Allergen, Cottonwood, t14: <0.1 kU/L
Allergen, D pternoyssinus,d7: <0.1 kU/L
Allergen, Mouse Urine Protein, e78: <0.1 kU/L
Allergen, Mulberry, t76: <0.1 kU/L
Allergen, Oak,t7: <0.1 kU/L
Allergen, P. notatum, m1: 0.21 kU/L — ABNORMAL HIGH
Aspergillus fumigatus, m3: 0.3 kU/L — ABNORMAL HIGH
Bermuda Grass: <0.1 kU/L
Box Elder IgE: <0.1 kU/L
CLADOSPORIUM HERBARUM (M2) IGE: <0.1 kU/L
COMMON RAGWEED (SHORT) (W1) IGE: <0.1 kU/L
Cat Dander: <0.1 kU/L
Class: 0
Class: 0
Class: 0
Class: 0
Class: 0
Class: 0
Class: 0
Class: 0
Class: 0
Class: 0
Class: 0
Class: 0
Class: 0
Class: 0
Class: 0
Class: 0
Class: 0
Class: 0
Class: 0
Class: 0
Cockroach: <0.1 kU/L
D. farinae: <0.1 kU/L
Dog Dander: 0.16 kU/L — ABNORMAL HIGH
Elm IgE: <0.1 kU/L
IgE (Immunoglobulin E), Serum: 31 kU/L (ref <=114)
Johnson Grass: <0.1 kU/L
Pecan/Hickory Tree IgE: <0.1 kU/L
Rough Pigweed  IgE: <0.1 kU/L
Sheep Sorrel IgE: <0.1 kU/L
Timothy Grass: <0.1 kU/L

## 2023-02-12 LAB — DOG DANDER COMPONENT
Can f 4(e229) IgE: <0.1 kU/L (ref <0.10)
Can f 6(e230) IgE: <0.1 kU/L (ref <0.10)
E101-IgE Can f 1: <0.1 kU/L (ref <0.10)
E102-IgE Can f 2: <0.1 kU/L (ref <0.10)
E221-IgE Can f 3: <0.1 kU/L (ref <0.10)
E226-IgE Can f 5: 0.45 kU/L — ABNORMAL HIGH (ref <0.10)

## 2023-02-12 LAB — INTERPRETATION:

## 2023-04-16 ENCOUNTER — Encounter: Payer: Self-pay | Admitting: Internal Medicine

## 2023-04-16 DIAGNOSIS — J4541 Moderate persistent asthma with (acute) exacerbation: Secondary | ICD-10-CM

## 2023-04-20 MED ORDER — ALBUTEROL SULFATE HFA 108 (90 BASE) MCG/ACT IN AERS: 2.0000 | INHALATION_SPRAY | Freq: Four times a day (QID) | RESPIRATORY_TRACT | 2 refills | Status: AC | PRN

## 2023-04-20 MED ORDER — FLUTICASONE-SALMETEROL 100-50 MCG/ACT IN AEPB: 1.0000 | INHALATION_SPRAY | Freq: Two times a day (BID) | RESPIRATORY_TRACT | 5 refills | Status: DC

## 2023-05-29 ENCOUNTER — Ambulatory Visit (HOSPITAL_COMMUNITY): Admission: EM | Admit: 2023-05-29 | Discharge: 2023-05-29 | Disposition: A | Discharge status: Home / Self Care

## 2023-05-29 ENCOUNTER — Encounter (HOSPITAL_COMMUNITY): Payer: Self-pay | Admitting: Emergency Medicine

## 2023-05-29 DIAGNOSIS — M25512 Pain in left shoulder: Secondary | ICD-10-CM | POA: Diagnosis present

## 2023-05-29 DIAGNOSIS — X58XXXA Exposure to other specified factors, initial encounter: Secondary | ICD-10-CM | POA: Diagnosis not present

## 2023-05-29 DIAGNOSIS — T148XXA Other injury of unspecified body region, initial encounter: Secondary | ICD-10-CM

## 2023-05-29 DIAGNOSIS — S46812A Strain of other muscles, fascia and tendons at shoulder and upper arm level, left arm, initial encounter: Secondary | ICD-10-CM | POA: Insufficient documentation

## 2023-05-29 DIAGNOSIS — R590 Localized enlarged lymph nodes: Secondary | ICD-10-CM

## 2023-05-29 LAB — POCT URINALYSIS DIP (MANUAL ENTRY)
Bilirubin, UA: NEGATIVE
Blood, UA: NEGATIVE
Glucose, UA: NEGATIVE mg/dL
Ketones, POC UA: NEGATIVE mg/dL
Nitrite, UA: NEGATIVE
Protein Ur, POC: NEGATIVE mg/dL
Spec Grav, UA: 1.025
Urobilinogen, UA: 0.2 U/dL
pH, UA: 6

## 2023-05-29 LAB — CBC
HCT: 41.7 % (ref 36.0–46.0)
Hemoglobin: 14.3 g/dL (ref 12.0–15.0)
MCH: 30.5 pg (ref 26.0–34.0)
MCHC: 34.3 g/dL (ref 30.0–36.0)
MCV: 88.9 fL (ref 80.0–100.0)
Platelets: 466 10*3/uL — ABNORMAL HIGH (ref 150–400)
RBC: 4.69 MIL/uL (ref 3.87–5.11)
RDW: 13.2 % (ref 11.5–15.5)
WBC: 3.2 10*3/uL — ABNORMAL LOW (ref 4.0–10.5)
nRBC: 0 % (ref 0.0–0.2)

## 2023-05-29 LAB — COMPREHENSIVE METABOLIC PANEL
ALT: 26 U/L (ref 0–44)
AST: 34 U/L (ref 15–41)
Albumin: 4 g/dL (ref 3.5–5.0)
Alkaline Phosphatase: 46 U/L (ref 38–126)
Anion gap: 14 (ref 5–15)
BUN: 9 mg/dL (ref 6–20)
CO2: 23 mmol/L (ref 22–32)
Calcium: 9.4 mg/dL (ref 8.9–10.3)
Chloride: 102 mmol/L (ref 98–111)
Creatinine, Ser: 1.23 mg/dL — ABNORMAL HIGH (ref 0.44–1.00)
GFR, Estimated: >60 mL/min (ref >60)
Glucose, Bld: 53 mg/dL — ABNORMAL LOW (ref 70–99)
Potassium: 3.5 mmol/L (ref 3.5–5.1)
Sodium: 139 mmol/L (ref 135–145)
Total Bilirubin: 0.6 mg/dL (ref 0.0–1.2)
Total Protein: 7.7 g/dL (ref 6.5–8.1)

## 2023-05-29 LAB — POCT URINE PREGNANCY: Preg Test, Ur: NEGATIVE

## 2023-05-29 MED ORDER — NITROFURANTOIN MONOHYD MACRO 100 MG PO CAPS: 100.0000 mg | ORAL_CAPSULE | Freq: Two times a day (BID) | ORAL | 0 refills | Status: DC

## 2023-05-29 MED ORDER — NAPROXEN 500 MG PO TABS: 500.0000 mg | ORAL_TABLET | Freq: Two times a day (BID) | ORAL | 0 refills | Status: DC

## 2023-05-29 MED ORDER — CYCLOBENZAPRINE HCL 10 MG PO TABS
10.0000 mg | ORAL_TABLET | Freq: Two times a day (BID) | ORAL | 0 refills | Status: AC | PRN
Start: 2023-05-29 — End: ?

## 2023-05-29 MED ORDER — PHENAZOPYRIDINE HCL 200 MG PO TABS
200.0000 mg | ORAL_TABLET | Freq: Three times a day (TID) | ORAL | 0 refills | Status: AC
Start: 2023-05-29 — End: ?

## 2023-05-29 NOTE — ED Provider Notes (Signed)
 [facility]  Note:  This document was prepared using Dragon voice recognition software and may include unintentional dictation errors.  MRN: [id number] DOB: 15-Sep-1995  Subjective:   Brandy Sanchez is a 28 y.o. female presenting for left subscapular muscle pain with movement x 2 days.  No known injury.  Tylenol for pain with minimal improvement.  No past injuries noted by patient.  Painful bump in right groin area x 1 week.  Increased pain with palpation.  Patient denies any vaginal discharge, viral symptoms, draining skin lesions.  Denies history of STI.  Patient reports mild dysuria symptoms.  No past history of inguinal cyst.    No current facility-administered medications for this encounter.  Current Outpatient Medications:    cyclobenzaprine (FLEXERIL) 10 MG tablet, Take 1 tablet (10 mg total) by mouth 2 (two) times daily as needed for muscle spasms., Disp: 20 tablet, Rfl: 0   HUMIRA, 2 PEN, 40 MG/0.4ML pen, Inject into the skin., Disp: , Rfl:    naproxen (NAPROSYN) 500 MG tablet, Take 1 tablet (500 mg total) by mouth 2 (two) times daily., Disp: 30 tablet, Rfl: 0   nitrofurantoin, macrocrystal-monohydrate, (MACROBID) 100 MG capsule, Take 1 capsule (100 mg total) by mouth 2 (two) times daily., Disp: 10 capsule, Rfl: 0   phenazopyridine (PYRIDIUM) 200 MG tablet, Take 1 tablet (200 mg total) by mouth 3 (three) times daily., Disp: 6 tablet, Rfl: 0   Adalimumab-adaz 40 MG/0.4ML SOAJ, Inject 40 mg into the skin every 14 (fourteen) days., Disp: , Rfl:    albuterol (VENTOLIN HFA) 108 (90 Base) MCG/ACT inhaler, Inhale 2 puffs into the lungs every 6 (six) hours as needed for wheezing or shortness of breath., Disp: 8 g, Rfl: 2   azaTHIOprine (IMURAN) 50 MG tablet, Take 1 tablet by mouth daily., Disp: , Rfl:    fluticasone-salmeterol (ADVAIR) 100-50 MCG/ACT AEPB, Inhale 1 puff into the lungs 2 (two) times daily., Disp: 60 each, Rfl: 5   levonorgestrel-ethinyl estradiol (SRONYX)  0.1-20 MG-MCG tablet, Take 1 tablet by mouth daily., Disp: 84 tablet, Rfl: 3   montelukast (SINGULAIR) 10 MG tablet, Take 1 tablet (10 mg total) by mouth at bedtime., Disp: 30 tablet, Rfl: 11   mycophenolate (CELLCEPT) 500 MG tablet, Take 500 mg by mouth 2 (two) times daily., Disp: , Rfl:    No Known Allergies  Past Medical History:  Diagnosis Date   Allergic rhinitis    Anxiety    Viral thyroiditis 09/09/2013     Past Surgical History:  Procedure Laterality Date   CYST EXCISION Right 05/30/2018   Procedure: CYST REMOVAL RIGHT WRIST;  Surgeon: [name], MD;  Location: [facility];  Service: Orthopedics;  Laterality: Right;    Family History  Problem Relation Age of Onset   Thyroid disease Mother        removed due to goiter and trouble swallowing   Hypertension Mother    Asthma Sister    Thyroid disease Maternal Grandmother    Diabetes Paternal Grandmother    Thyroid disease Maternal Aunt        removed- enlarged    Social History   Tobacco Use   Smoking status: Never   Smokeless tobacco: Never  Vaping Use   Vaping status: Never Used  Substance Use Topics   Alcohol use: No   Drug use: No    ROS Refer to HPI for ROS details.  Objective:   Vitals: BP 131/84 (BP Location: Right Arm)   Pulse 75  Temp 98.6 F (37 C) (Oral)   Resp 17   LMP 04/04/2023   SpO2 98%   Physical Exam Vitals and nursing note reviewed. Exam conducted with a chaperone present.  Constitutional:      General: She is not in acute distress.    Appearance: Normal appearance. She is well-developed. She is not ill-appearing or toxic-appearing.  HENT:     Head: Normocephalic.  Cardiovascular:     Rate and Rhythm: Normal rate.  Pulmonary:     Effort: Pulmonary effort is normal. No respiratory distress.  Abdominal:     Tenderness: There is no abdominal tenderness.  Musculoskeletal:        General: No swelling.     Cervical back: Normal range of motion and neck supple.  Tenderness present.  Lymphadenopathy:     Lower Body: Right inguinal adenopathy present.  Skin:    General: Skin is warm and dry.  Neurological:     General: No focal deficit present.     Mental Status: She is alert and oriented to person, place, and time.  Psychiatric:        Mood and Affect: Mood normal.     Procedures  Results for orders placed or performed during the hospital encounter of 05/29/23 (from the past 24 hours)  POC urinalysis dipstick     Status: Abnormal   Collection Time: 05/29/23  8:59 AM  Result Value Ref Range   Color, UA yellow    Clarity, UA cloudy (A)    Glucose, UA negative mg/dL   Bilirubin, UA negative    Ketones, POC UA negative mg/dL   Spec Grav, UA 9.147    Blood, UA negative    pH, UA 6.0    Protein Ur, POC negative mg/dL   Urobilinogen, UA 0.2 E.U./dL   Nitrite, UA Negative    Leukocytes, UA Small (1+) (A)   POCT urine pregnancy     Status: Normal   Collection Time: 05/29/23  9:00 AM  Result Value Ref Range   Preg Test, Ur Negative     Assessment and Plan :   PDMP not reviewed this encounter.  1. Muscle strain   2. Lymphadenopathy, inguinal    Subscapular muscle strain -Take prescribed naproxen 500 mg twice daily as needed for muscle pain and inflammation secondary to subscapular muscle strain -Take prescribed cyclobenzaprine 10 mg tablet 2-3 times daily as needed for muscle spasms and pain -Do not take cyclobenzaprine when you have to drive for work as it could cause drowsiness and impairment. -Minimize heavy lifting or strenuous activity with left arm as this could exacerbate injury. -Continue to monitor symptoms for worsening severity if you experience any escalation of symptoms follow-up for further evaluation and management  Inguinal lymphadenopathy -Point-of-care UA performed in UC shows small leukocytes, no nitrates, no blood, these findings are possibly indicative of early urinary tract infection. -Urine hCG negative for  pregnancy -Take prescribed Macrobid 100 mg twice daily for 5 days for treatment of possible urinary tract infection -Take prescribed Pyridium 200 mg 3 times daily for 2 days for dysuria symptoms secondary to UTI -Serum CBC, CMP collected in UC results pending should be available in the next 24 hours.  If any abnormality noted on report you will be contacted appropriate treatment provided -Continue to monitor symptoms if symptoms persist or progress follow-up with primary care for further evaluation.  Lucky Cowboy   [place], [place], [place] 05/29/23 [phone]

## 2023-05-29 NOTE — Discharge Instructions (Addendum)
 Subscapular muscle strain -Take prescribed naproxen 500 mg twice daily as needed for muscle pain and inflammation secondary to subscapular muscle strain -Take prescribed cyclobenzaprine 10 mg tablet 2-3 times daily as needed for muscle spasms and pain -Do not take cyclobenzaprine when you have to drive for work as it could cause drowsiness and impairment. -Minimize heavy lifting or strenuous activity with left arm as this could exacerbate injury. -Continue to monitor symptoms for worsening severity if you experience any escalation of symptoms follow-up for further evaluation and management  Inguinal lymphadenopathy -Point-of-care UA performed in UC shows small leukocytes, no nitrates, no blood, these findings are possibly indicative of early urinary tract infection. -Urine hCG negative for pregnancy -Take prescribed Macrobid 100 mg twice daily for 5 days for treatment of possible urinary tract infection -Take prescribed Pyridium 200 mg 3 times daily for 2 days for dysuria symptoms secondary to UTI -Serum CBC, CMP collected in UC results pending should be available in the next 24 hours.  If any abnormality noted on report you will be contacted appropriate treatment provided -Continue to monitor symptoms if symptoms persist or progress follow-up with primary care for further evaluation.

## 2023-05-29 NOTE — ED Triage Notes (Signed)
 Pt reports for a week has bump in right groin area that is under the skin. Is pain with palpation. Denies any drainage.   For 2 days having left shoulder pain that radiates to neck. Tried taking Tylenol for pain. Denies any lifting or injury.

## 2023-05-30 ENCOUNTER — Encounter (HOSPITAL_COMMUNITY): Payer: Self-pay

## 2023-05-30 LAB — URINE CULTURE
Culture: NO GROWTH
Special Requests: NORMAL

## 2024-02-06 ENCOUNTER — Encounter: Payer: Self-pay | Admitting: Internal Medicine

## 2024-02-06 ENCOUNTER — Ambulatory Visit (INDEPENDENT_AMBULATORY_CARE_PROVIDER_SITE_OTHER): Admitting: Internal Medicine

## 2024-02-06 VITALS — BP 110/62 | HR 94 | Temp 98.7°F | Ht 64.0 in | Wt 193.8 lb

## 2024-02-06 DIAGNOSIS — Z79899 Other long term (current) drug therapy: Secondary | ICD-10-CM

## 2024-02-06 DIAGNOSIS — R21 Rash and other nonspecific skin eruption: Secondary | ICD-10-CM

## 2024-02-06 DIAGNOSIS — H3090 Unspecified chorioretinal inflammation, unspecified eye: Secondary | ICD-10-CM

## 2024-02-06 DIAGNOSIS — Z23 Encounter for immunization: Secondary | ICD-10-CM

## 2024-02-06 DIAGNOSIS — L42 Pityriasis rosea: Secondary | ICD-10-CM | POA: Diagnosis not present

## 2024-02-06 NOTE — Progress Notes (Signed)
 Chief Complaint  Patient presents with   Pruritis    Pt c/o itch patches all over body. Went to [facility] on 2-3 wks ago. Was told she had ringworm.     HPI: Brandy Sanchez 28 y.o. come in for on going rash not reolving  Onset about 3 weeks ago  right back buttock area  and now spreading  chest upper arms and few on neck  none of face or palms  mild to mod itching .   Given ketoconazole and otc miconazole   2 x per day for 3-4 weeks   Hx of hu mira imuran   for eye disease.  Check   Q 4 months  steroid injection in eye  Rheum eval  no autoimmune dx .  ROS: See pertinent positives and negatives per HPI.  Past Medical History:  Diagnosis Date   Allergic rhinitis    Anxiety    Viral thyroiditis 09/09/2013    Family History  Problem Relation Age of Onset   Thyroid  disease Mother        removed due to goiter and trouble swallowing   Hypertension Mother    Asthma Sister    Thyroid  disease Maternal Grandmother    Diabetes Paternal Grandmother    Thyroid  disease Maternal Aunt        removed- enlarged    Social History   Socioeconomic History   Marital status: Single    Spouse name: Not on file   Number of children: Not on file   Years of education: Not on file   Highest education level: Not on file  Occupational History   Not on file  Tobacco Use   Smoking status: Never   Smokeless tobacco: Never  Vaping Use   Vaping status: Never Used  Substance and Sexual Activity   Alcohol use: No   Drug use: No   Sexual activity: Yes    Birth control/protection: Pill  Other Topics Concern   Not on file  Social History Narrative   Intact family   HH of 4  Lives with parents and 1 sister        [employer]  A and bs  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 8 g 2   cyclobenzaprine  (FLEXERIL ) 10 MG tablet Take 1 tablet (10 mg total) by mouth 2 (two) times daily as needed for muscle spasms. 20 tablet 0   fluticasone -salmeterol (ADVAIR ) 100-50 MCG/ACT AEPB Inhale 1 puff into the lungs 2 (two) times daily. 60 each 5   levonorgestrel -ethinyl estradiol (SRONYX) 0.1-20 MG-MCG tablet Take 1 tablet by mouth daily. 84 tablet 3   montelukast  (SINGULAIR ) 10 MG tablet Take 1 tablet (10 mg total) by mouth at bedtime. 30 tablet 11   mycophenolate (CELLCEPT) 500 MG tablet Take 1,500 mg by mouth 2 (two) times  daily.     phenazopyridine  (PYRIDIUM ) 200 MG tablet Take 1 tablet (200 mg total) by mouth 3 (three) times daily. 6 tablet 0   Adalimumab-adaz 40 MG/0.4ML SOAJ Inject 40 mg into the skin every 14 (fourteen) days. (Patient not taking: Reported on 02/06/2024)     azaTHIOprine (IMURAN) 50 MG tablet Take 1 tablet by mouth daily. (Patient not taking: Reported on 02/06/2024)     HUMIRA, 2 PEN, 40 MG/0.4ML pen Inject into the skin. (Patient not taking: Reported on 02/06/2024)     naproxen  (NAPROSYN ) 500 MG tablet Take 1 tablet (500 mg total) by mouth 2 (two) times daily. (Patient not taking: Reported on 02/06/2024) 30 tablet 0   nitrofurantoin , macrocrystal-monohydrate, (MACROBID ) 100 MG capsule Take 1 capsule (100 mg total) by mouth 2 (two) times daily. (Patient not taking: Reported on 02/06/2024) 10 capsule 0   No facility-administered medications prior to visit.     EXAM:  BP 110/62 (BP Location: Left Arm, Patient Position: Sitting, Cuff Size: Large)   Pulse 94   Temp 98.7 F (37.1 C) (Oral)   Ht 5' 4 (1.626 m)    Wt 193 lb 12.8 oz (87.9 kg)   LMP 01/19/2024 (Exact Date)   SpO2 99%   BMI 33.27 kg/m   Body mass index is 33.27 kg/m.  GENERAL: vitals reviewed and listed above, alert, oriented, appears well hydrated and in no acute distress Skin  right lower back  area  is herald patch  about 4 cm ovoid  with out much scaling but  edging  ring    Anterior trunk   chest breast area  and upper arms have pigmented  non scaly rash area discrete  some tree line districbution   only a few on back .  Non on  palms   no facial rash  MS: moves all extremities without noticeable focal  abnormality PSYCH: pleasant and cooperative, no obvious depression or anxiety Lab Results  Component Value Date   WBC 3.2 (L) 05/29/2023   HGB 14.3 05/29/2023   HCT 41.7 05/29/2023   PLT 466 (H) 05/29/2023   GLUCOSE 53 (L) 05/29/2023   CHOL 127 06/06/2022   TRIG 122.0 06/06/2022   HDL 62.30 06/06/2022   LDLCALC 40 06/06/2022   ALT 26 05/29/2023   AST 34 05/29/2023   NA 139 05/29/2023   K 3.5 05/29/2023   CL 102 05/29/2023   CREATININE 1.23 (H) 05/29/2023   BUN 9 05/29/2023   CO2 23 05/29/2023   TSH 0.63 06/06/2022   HGBA1C 5.4 06/06/2022   BP Readings from Last 3 Encounters:  02/06/24 110/62  05/29/23 131/84  02/08/23 108/68  Record review   [facility] recent October lab neg quantiferon  and last cbc  etc months ago .   ASSESSMENT AND PLAN:  Discussed the following assessment and plan:  Pityriasis rosea  Influenza vaccine needed - Plan: Flu vaccine trivalent PF, 6mos and older(Flulaval,Afluria,Fluarix,Fluzone)  Rash - Plan: RPR, CBC with Differential/Platelet, Basic Metabolic Panel  High risk medication use  Chorioretinitis, unspecified laterality - under rx by opthalm retinal specialist  no other autoimmune dx Showed patient pic s of PR  and diff dx  sym rx . Doubt other dx but  a bit atypical with location ant chest    Exp management may be around for another 3-6 weeks  but if  persistent or  progressive and concern we can get dermatology to check also .  She is/has been on immunosuppressant regimens.  Can  stop the  antifungal topicals and   topical hcs ok if needed .   Update lab today bmp and cbc and rpr -Patient advised to return or notify health care team  if  new concerns arise. In interim  Further Record review after patient left  she has a hx of pos RAF  last cbc found was 3 weeks ago .( Didn't see at the time of visit .  (Also ordered was cellcept at end of October but dont know if she is taking ) Patient Instructions  I think this may be pityriasis  rosea   as we discussed   and   local care  until resolves.   Antifungal wont help at t his time  If needed can use otc hydrocortisone for itching otherwise   supportive care.   If   persistent or progressive we can recheck and or get derm to see  you .  Bryker Fletchall K. Lovene Maret M.D.

## 2024-02-06 NOTE — Patient Instructions (Signed)
 I think this may be pityriasis  rosea   as we discussed   and   local care  until resolves.   Antifungal wont help at t his time  If needed can use otc hydrocortisone for itching otherwise   supportive care.   If   persistent or progressive we can recheck and or get derm to see  you .

## 2024-02-07 ENCOUNTER — Ambulatory Visit: Payer: Self-pay | Admitting: Internal Medicine

## 2024-02-07 LAB — CBC WITH DIFFERENTIAL/PLATELET
Basophils Absolute: 0.1 K/uL (ref 0.0–0.1)
Basophils Relative: 2.1 % (ref 0.0–3.0)
Eosinophils Absolute: 0.1 K/uL (ref 0.0–0.7)
Eosinophils Relative: 4.1 % (ref 0.0–5.0)
HCT: 38.9 % (ref 36.0–46.0)
Hemoglobin: 13.2 g/dL (ref 12.0–15.0)
Lymphocytes Relative: 49.5 % — ABNORMAL HIGH (ref 12.0–46.0)
Lymphs Abs: 1.8 K/uL (ref 0.7–4.0)
MCHC: 33.8 g/dL (ref 30.0–36.0)
MCV: 88.6 fl (ref 78.0–100.0)
Monocytes Absolute: 0.5 K/uL (ref 0.1–1.0)
Monocytes Relative: 12.5 % — ABNORMAL HIGH (ref 3.0–12.0)
Neutro Abs: 1.2 K/uL — ABNORMAL LOW (ref 1.4–7.7)
Neutrophils Relative %: 31.8 % — ABNORMAL LOW (ref 43.0–77.0)
Platelets: 441 K/uL — ABNORMAL HIGH (ref 150.0–400.0)
RBC: 4.39 Mil/uL (ref 3.87–5.11)
RDW: 12.7 % (ref 11.5–15.5)
WBC: 3.7 K/uL — ABNORMAL LOW (ref 4.0–10.5)

## 2024-02-07 LAB — RPR: RPR Ser Ql: NONREACTIVE

## 2024-02-07 LAB — BASIC METABOLIC PANEL WITH GFR
BUN: 8 mg/dL (ref 6–23)
CO2: 24 meq/L (ref 19–32)
Calcium: 9.2 mg/dL (ref 8.4–10.5)
Chloride: 104 meq/L (ref 96–112)
Creatinine, Ser: 1 mg/dL (ref 0.40–1.20)
GFR: 77.61 mL/min (ref >60.00)
Glucose, Bld: 79 mg/dL (ref 70–99)
Potassium: 4 meq/L (ref 3.5–5.1)
Sodium: 137 meq/L (ref 135–145)

## 2024-02-07 NOTE — Progress Notes (Signed)
 Results show neg rpr no anemia     after you left I was able to find the other labs that your eye doctor did   3-4 weeks ago . Please advise : are you taking cellcept that was prescribed at some  point  . This was  unclear at the visit.    So I think the is  pytiriasis rosea  as we discussed   . If not resolving after 3-4  more weeks or uncertain  we can get dermatologist to ee you

## 2024-03-10 ENCOUNTER — Encounter: Payer: Self-pay | Admitting: Internal Medicine

## 2024-03-10 ENCOUNTER — Ambulatory Visit: Admitting: Internal Medicine

## 2024-03-10 VITALS — BP 124/72 | HR 78 | Temp 98.6°F | Ht 64.0 in | Wt 184.0 lb

## 2024-03-10 DIAGNOSIS — J301 Allergic rhinitis due to pollen: Secondary | ICD-10-CM

## 2024-03-10 DIAGNOSIS — D7219 Other eosinophilia: Secondary | ICD-10-CM | POA: Diagnosis not present

## 2024-03-10 DIAGNOSIS — J309 Allergic rhinitis, unspecified: Secondary | ICD-10-CM

## 2024-03-10 DIAGNOSIS — J4540 Moderate persistent asthma, uncomplicated: Secondary | ICD-10-CM

## 2024-03-10 DIAGNOSIS — J4541 Moderate persistent asthma with (acute) exacerbation: Secondary | ICD-10-CM

## 2024-03-10 MED ORDER — FLUTICASONE-SALMETEROL 100-50 MCG/ACT IN AEPB: 1.0000 | INHALATION_SPRAY | Freq: Two times a day (BID) | RESPIRATORY_TRACT | 5 refills | Status: DC

## 2024-03-10 MED ORDER — MONTELUKAST SODIUM 10 MG PO TABS: 10.0000 mg | ORAL_TABLET | Freq: Every day | ORAL | 11 refills | Status: AC

## 2024-03-10 MED ORDER — FLUTICASONE-SALMETEROL 100-50 MCG/ACT IN AEPB: 1.0000 | INHALATION_SPRAY | Freq: Two times a day (BID) | RESPIRATORY_TRACT | 5 refills | Status: AC

## 2024-03-10 NOTE — Progress Notes (Signed)
 "              Brandy Sanchez    [id number]    10-Nov-1995  Primary Care Physician:Panosh, Apolinar POUR, MD Date of Appointment: 03/10/2024 Established Patient Visit  Chief complaint:   Chief Complaint  Patient presents with   Asthma    Somewhat controlled      HPI: Brandy Sanchez is a 28 y.o. woman with moderate persistent asthma.   Interval Updates: Here for asthma follow up after about a year. Has been off advair  for about 3-4 months.  Has been using albuterol  everyday 2-4 times/day.   She recently changed from working at home to working at [employer]. This has also worsened her asthma. She's on the warehouse floor, working with packages.   No interval prednisone .  She has also run out of montelukast .   Feels asthma symptoms currently not well controlled. Wheezing is worsening.   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   Current Regimen: advair  230 2 puffs twice daily prn albuterol   Asthma Triggers: exertion Exacerbations in the last year: none History of hospitalization or intubation: none Allergy  Testing: never had GERD: none Allergic Rhinitis: yes, but currently on any meds ACT:  Asthma Control Test ACT Total Score  03/10/2024  2:50 PM 14  04/26/2021  1:56 PM 19   FeNO: 19 ppb  I have reviewed the patient's family social and past medical history and updated as appropriate.   Past Medical History:  Diagnosis Date   Allergic rhinitis    Anxiety    Viral thyroiditis 09/09/2013    Past Surgical History:  Procedure Laterality Date   CYST EXCISION Right 05/30/2018   Procedure: CYST REMOVAL RIGHT WRIST;  Surgeon: Murrell Kuba, MD;  Location: [facility];  Service: Orthopedics;  Laterality: Right;    Family History  Problem Relation Age of Onset   Thyroid  disease Mother        removed due to goiter and trouble swallowing   Hypertension Mother    Asthma Sister    Thyroid  disease  Maternal Grandmother    Diabetes Paternal Grandmother    Thyroid  disease Maternal Aunt        removed- enlarged    Social History   Occupational History   Not on file  Tobacco Use   Smoking status: Never   Smokeless tobacco: Never  Vaping Use   Vaping status: Never Used  Substance and Sexual Activity   Alcohol use: No   Drug use: No   Sexual activity: Yes    Birth control/protection: Pill     Physical Exam: Blood pressure 124/72, pulse 78, temperature 98.6 F (37 C), temperature source Oral, height 5' 4 (1.626 m), weight 184 lb (83.5 kg), SpO2 100%.  Gen:      No acute distress Lungs:    ctab no wheezes CV:         RRR no mrg   Data Reviewed: Imaging: I have personally reviewed the chest xray 11/2020 shows no acute process  PFTs:     Latest Ref Rng & Units 01/29/2023    4:00 PM  PFT Results  FVC-Pre L 3.55   FVC-Predicted Pre % 88   FVC-Post L 3.50   FVC-Predicted Post % 86   Pre FEV1/FVC % % 69   Post FEV1/FCV % % 85   FEV1-Pre L 2.47   FEV1-Predicted Pre % 71   FEV1-Post L  2.99   DLCO uncorrected ml/min/mmHg 23.83   DLCO UNC% % 99   DLCO corrected ml/min/mmHg 23.83   DLCO COR %Predicted % 99   DLVA Predicted % 121   TLC L 4.62   TLC % Predicted % 86   RV % Predicted % 84    I have personally reviewed the patient's PFTs and spirometry 01/29/2023 shows no airflow limitation.   Labs: Lab Results  Component Value Date   WBC 3.7 (L) 02/06/2024   HGB 13.2 02/06/2024   HCT 38.9 02/06/2024   MCV 88.6 02/06/2024   PLT 441.0 (H) 02/06/2024   Lab Results  Component Value Date   NA 137 02/06/2024   K 4.0 02/06/2024   CL 104 02/06/2024   CO2 24 02/06/2024   Total ige 31 in 2024  Immunization status: Immunization History  Administered Date(s) Administered   DTP 07/16/1997, 09/03/1997, 11/11/1997, 08/02/1998, 08/26/2002   HIB (PRP-OMP) 07/16/1997, 09/03/1997, 11/11/1997, 05/03/1998   HPV 9-valent 07/23/2015   HPV Quadrivalent 08/12/2010,  10/13/2010   Hepatitis A, Adult 07/23/2015   Hepatitis B December [birth year], 1995, 09/03/1997, 11/11/1997   Influenza Inj Mdck Quad Pf 04/09/2020, 02/06/2021   Influenza Split 01/18/2011, 01/15/2012   Influenza Whole 01/29/2009, 02/09/2010   Influenza, Mdck, Trivalent,PF 6+ MOS(egg free) 11/28/2022   Influenza, Seasonal, Injecte, Preservative Fre 02/06/2024   Influenza,inj,Quad PF,6+ Mos 01/21/2013, 12/29/2014, 12/04/2016   MMR 05/03/1998, 08/26/2002   Meningococcal Conjugate 08/12/2010   Meningococcal polysaccharide vaccine (MPSV4) 08/26/2008   Moderna Sars-Covid-2 Vaccination 05/24/2019, 06/21/2019, 04/09/2020   OPV 07/16/1997, 09/03/1997, 05/03/1998, 08/26/2002   PPD Test 08/25/2013, 08/09/2016, 02/23/2023   Pfizer Covid-19 Vaccine Bivalent Booster 43yrs & up 02/06/2021   Pneumococcal Conjugate-13 12/27/1998, 02/08/1999   Td 08/26/2008   Tdap 10/02/2018   Varicella 05/03/1998, 08/26/2008    External Records Personally Reviewed: PCP  Assessment:  Moderate persistent asthma, not well controlled Peripheral eosinophilia Chronic allergic rhinitis, not well controlled  Plan/Recommendations: Sorry to hear your breathing isn't doing well.  Let's get you back on medication for your asthma.  Start taking advair  2 puffs twice daily, gargle after use.  Take the albuterol  rescue inhaler every 4 to 6 hours as needed for wheezing or shortness of breath. You can also take it 15 minutes before exercise or exertional activity. Side effects include heart racing or pounding, jitters or anxiety. If you have a history of an irregular heart rhythm, it can make this worse. Can also give some patients a hard time sleeping.  Resume montelukast  and flonase  nasal spray.   Return to Care: Return in about 6 months (around 09/08/2024) for [employer].   Verdon Gore, MD Pulmonary and Critical Care Medicine [facility] Office:[phone]      "

## 2024-03-10 NOTE — Patient Instructions (Addendum)
 It was a pleasure to see you today!  Please schedule follow up with Landry Ferrari in 6 months. Please call sooner [phone] if issues or concerns arise. You can also send us  a message through MyChart, but but aware that this is not to be used for urgent issues and it may take up to 5-7 days to receive a reply. Please be aware that you will likely be able to view your results before I have a chance to respond to them. Please give us  5 business days to respond to any non-urgent results.   Sorry to hear your breathing isn't doing well.  Let's get you back on medication for your asthma.  Start taking advair  2 puffs twice daily, gargle after use.  Take the albuterol  rescue inhaler every 4 to 6 hours as needed for wheezing or shortness of breath. You can also take it 15 minutes before exercise or exertional activity. Side effects include heart racing or pounding, jitters or anxiety. If you have a history of an irregular heart rhythm, it can make this worse. Can also give some patients a hard time sleeping.  Resume montelukast  and flonase  nasal spray.

## 2024-03-17 ENCOUNTER — Encounter: Payer: Self-pay | Admitting: Internal Medicine

## 2024-03-17 DIAGNOSIS — H3090 Unspecified chorioretinal inflammation, unspecified eye: Secondary | ICD-10-CM

## 2024-03-17 DIAGNOSIS — Z3041 Encounter for surveillance of contraceptive pills: Secondary | ICD-10-CM

## 2024-03-17 DIAGNOSIS — R635 Abnormal weight gain: Secondary | ICD-10-CM

## 2024-03-17 DIAGNOSIS — Z79899 Other long term (current) drug therapy: Secondary | ICD-10-CM

## 2024-03-17 DIAGNOSIS — Z0000 Encounter for general adult medical examination without abnormal findings: Secondary | ICD-10-CM

## 2024-03-17 DIAGNOSIS — E040 Nontoxic diffuse goiter: Secondary | ICD-10-CM

## 2024-03-18 MED ORDER — LEVONORGESTREL-ETHINYL ESTRAD 0.1-20 MG-MCG PO TABS: 1.0000 | ORAL_TABLET | Freq: Every day | ORAL | 3 refills | Status: AC
